# Patient Record
Sex: Female | Born: 1960 | Race: White | Hispanic: No | Marital: Married | Smoking: Never smoker
Health system: Southern US, Community
[De-identification: ages and names within clinical notes are randomized; demographics above are authoritative.]

## PROBLEM LIST (undated history)

## (undated) DIAGNOSIS — T753XXA Motion sickness, initial encounter: Secondary | ICD-10-CM

## (undated) DIAGNOSIS — M1990 Unspecified osteoarthritis, unspecified site: Secondary | ICD-10-CM

## (undated) DIAGNOSIS — E785 Hyperlipidemia, unspecified: Secondary | ICD-10-CM

## (undated) DIAGNOSIS — D649 Anemia, unspecified: Secondary | ICD-10-CM

## (undated) DIAGNOSIS — Z973 Presence of spectacles and contact lenses: Secondary | ICD-10-CM

## (undated) DIAGNOSIS — L80 Vitiligo: Secondary | ICD-10-CM

## (undated) DIAGNOSIS — I8390 Asymptomatic varicose veins of unspecified lower extremity: Secondary | ICD-10-CM

## (undated) DIAGNOSIS — E559 Vitamin D deficiency, unspecified: Secondary | ICD-10-CM

## (undated) HISTORY — DX: Vitiligo: L80

## (undated) HISTORY — DX: Unspecified osteoarthritis, unspecified site: M19.90

## (undated) HISTORY — DX: Vitamin D deficiency, unspecified: E55.9

## (undated) HISTORY — DX: Anemia, unspecified: D64.9

## (undated) HISTORY — DX: Asymptomatic varicose veins of unspecified lower extremity: I83.90

## (undated) HISTORY — DX: Hyperlipidemia, unspecified: E78.5

---

## 1998-10-30 HISTORY — PX: COLPOSCOPY: SHX161

## 2005-12-05 ENCOUNTER — Ambulatory Visit: Payer: Self-pay

## 2005-12-27 ENCOUNTER — Ambulatory Visit: Payer: Self-pay

## 2006-01-10 DIAGNOSIS — N63 Unspecified lump in unspecified breast: Secondary | ICD-10-CM | POA: Insufficient documentation

## 2006-12-11 ENCOUNTER — Ambulatory Visit: Payer: Self-pay

## 2009-05-13 DIAGNOSIS — E7800 Pure hypercholesterolemia, unspecified: Secondary | ICD-10-CM | POA: Insufficient documentation

## 2009-05-25 ENCOUNTER — Ambulatory Visit: Payer: Self-pay

## 2009-05-27 DIAGNOSIS — E559 Vitamin D deficiency, unspecified: Secondary | ICD-10-CM | POA: Insufficient documentation

## 2009-05-28 ENCOUNTER — Ambulatory Visit: Payer: Self-pay

## 2012-06-20 ENCOUNTER — Ambulatory Visit: Payer: Self-pay | Admitting: Family Medicine

## 2012-09-19 HISTORY — PX: COLONOSCOPY: SHX174

## 2014-05-21 ENCOUNTER — Ambulatory Visit: Payer: Self-pay | Admitting: Family Medicine

## 2016-05-24 DIAGNOSIS — L80 Vitiligo: Secondary | ICD-10-CM | POA: Insufficient documentation

## 2016-05-24 DIAGNOSIS — I8390 Asymptomatic varicose veins of unspecified lower extremity: Secondary | ICD-10-CM | POA: Insufficient documentation

## 2016-05-24 DIAGNOSIS — M1990 Unspecified osteoarthritis, unspecified site: Secondary | ICD-10-CM | POA: Insufficient documentation

## 2016-05-24 DIAGNOSIS — R609 Edema, unspecified: Secondary | ICD-10-CM | POA: Insufficient documentation

## 2016-05-24 DIAGNOSIS — D649 Anemia, unspecified: Secondary | ICD-10-CM | POA: Insufficient documentation

## 2016-05-24 DIAGNOSIS — R9431 Abnormal electrocardiogram [ECG] [EKG]: Secondary | ICD-10-CM | POA: Insufficient documentation

## 2016-05-25 ENCOUNTER — Encounter: Payer: Self-pay | Admitting: Physician Assistant

## 2016-05-25 ENCOUNTER — Ambulatory Visit (INDEPENDENT_AMBULATORY_CARE_PROVIDER_SITE_OTHER): Payer: BC Managed Care – PPO | Admitting: Physician Assistant

## 2016-05-25 VITALS — BP 118/74 | HR 72 | Temp 98.5°F | Resp 16 | Ht 67.25 in | Wt 187.0 lb

## 2016-05-25 DIAGNOSIS — R251 Tremor, unspecified: Secondary | ICD-10-CM

## 2016-05-25 DIAGNOSIS — R258 Other abnormal involuntary movements: Secondary | ICD-10-CM | POA: Diagnosis not present

## 2016-05-25 DIAGNOSIS — Z0000 Encounter for general adult medical examination without abnormal findings: Secondary | ICD-10-CM | POA: Diagnosis not present

## 2016-05-25 DIAGNOSIS — Z124 Encounter for screening for malignant neoplasm of cervix: Secondary | ICD-10-CM | POA: Diagnosis not present

## 2016-05-25 DIAGNOSIS — Z136 Encounter for screening for cardiovascular disorders: Secondary | ICD-10-CM

## 2016-05-25 DIAGNOSIS — Z1239 Encounter for other screening for malignant neoplasm of breast: Secondary | ICD-10-CM | POA: Diagnosis not present

## 2016-05-25 DIAGNOSIS — Z13220 Encounter for screening for lipoid disorders: Secondary | ICD-10-CM | POA: Diagnosis not present

## 2016-05-25 NOTE — Progress Notes (Signed)
Patient: Rebekah [name], Female    DOB: 1960-10-31, 55 y.o.   MRN: [id number] Visit Date: 05/25/2016  Today's Provider: Mar Daring, PA-C   Chief Complaint  Patient presents with  . Annual Exam   Subjective:    Annual physical exam Rebekah [name] is a 55 y.o. female who presents today for health maintenance and complete physical. She feels well. She reports exercising 4 days a week. She reports she is sleeping well.   Review of Systems  Constitutional: Negative.   HENT: Negative.   Eyes: Negative.   Respiratory: Negative.   Cardiovascular: Negative.   Gastrointestinal: Negative.   Endocrine: Negative.   Genitourinary: Negative.   Musculoskeletal: Negative.   Skin: Negative.   Allergic/Immunologic: Negative.   Neurological: Negative.   Hematological: Negative.   Psychiatric/Behavioral: Negative.     Social History      She  reports that she has never smoked. She does not have any smokeless tobacco history on file. She reports that she drinks about 0.6 oz of alcohol per week . She reports that she does not use drugs.       Social History   Social History  . Marital status: Married    Spouse name: N/A  . Number of children: 3  . Years of education: 12   Occupational History  . Teacher  [employer]   Social History Main Topics  . Smoking status: Never Smoker  . Smokeless tobacco: None  . Alcohol use 0.6 oz/week    1 Glasses of wine per week  . Drug use: No  . Sexual activity: Not Asked   Other Topics Concern  . None   Social History Narrative  . None    Past Medical History:  Diagnosis Date  . Anemia   . Arthritis   . Hyperlipidemia   . Varicose veins   . Vitamin D deficiency   . Vitiligo      Patient Active Problem List   Diagnosis Date Noted  . Abnormal ECG 05/24/2016  . Absolute anemia 05/24/2016  . Arthritis 05/24/2016  . Accumulation of fluid in tissues 05/24/2016  . 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 Vitiligo 05/24/2016  . Avitaminosis D 05/27/2009  . Hypercholesterolemia without hypertriglyceridemia 05/13/2009  . Breast lump 01/10/2006    Past Surgical History:  Procedure Laterality Date  . COLPOSCOPY  2000    Family History        Family Status  Relation Status  . Mother Deceased  . Father Deceased  . Brother Deceased        Her family history includes Alcohol abuse in her brother; Cancer in her brother and father; Hypertension in her father; Melanoma in her father.    No Known Allergies  Current Meds  Medication Sig  . Calcium Carbonate-Vitamin D 600-200 MG-UNIT TABS Take by mouth.  . Omega-3 Fatty Acids (FISH OIL MAXIMUM STRENGTH) 1200 MG CAPS Take by mouth.    Patient Care Team: Mar Daring, PA-C as PCP - General (Family Medicine)     Objective:   Vitals: BP 118/74 (BP Location: Left Arm, Patient Position: Sitting, Cuff Size: Normal)   Pulse 72   Temp 98.5 F (36.9 C)   Resp 16   Ht 5' 7.25" (1.708 m)   Wt 187 lb (84.8 kg)   BMI 29.07 kg/m    Physical Exam  Constitutional: She is oriented to person, place, and time. She  appears well-developed and well-nourished. No distress.  HENT:  Head: Normocephalic and atraumatic.  Right Ear: Hearing, tympanic membrane, external ear and ear canal normal.  Left Ear: Hearing, tympanic membrane, external ear and ear canal normal.  Nose: Nose normal.  Mouth/Throat: Uvula is midline, oropharynx is clear and moist and mucous membranes are normal. No oropharyngeal exudate.  Eyes: Conjunctivae and EOM are normal. Pupils are equal, round, and reactive to light. Right eye exhibits no discharge. Left eye exhibits no discharge. No scleral icterus.  Neck: Normal range of motion. Neck supple. No JVD present. Carotid bruit is not present. No tracheal deviation present. No thyromegaly present.  Cardiovascular: Normal rate, regular rhythm, normal heart sounds and intact distal pulses.  Exam reveals no gallop and no  friction rub.   No murmur heard. Pulmonary/Chest: Effort normal and breath sounds normal. No respiratory distress. She has no wheezes. She has no rales. She exhibits no tenderness. Right breast exhibits no inverted nipple, no mass, no nipple discharge, no skin change and no tenderness. Left breast exhibits no inverted nipple, no mass, no nipple discharge, no skin change and no tenderness. Breasts are symmetrical.  Abdominal: Soft. Bowel sounds are normal. She exhibits no distension and no mass. There is no tenderness. There is no rebound and no guarding. Hernia confirmed negative in the right inguinal area and confirmed negative in the left inguinal area.  Genitourinary: Rectum normal, vagina normal and uterus normal. No breast swelling, tenderness, discharge or bleeding. Pelvic exam was performed with patient supine. There is no rash, tenderness, lesion or injury on the right labia. There is no rash, tenderness, lesion or injury on the left labia. Cervix exhibits no motion tenderness, no discharge and no friability. Right adnexum displays no mass, no tenderness and no fullness. Left adnexum displays no mass, no tenderness and no fullness. No erythema, tenderness or bleeding in the vagina. No signs of injury around the vagina. No vaginal discharge found.  Musculoskeletal: Normal range of motion. She exhibits no edema or tenderness.  Lymphadenopathy:    She has no cervical adenopathy.       Right: No inguinal adenopathy present.       Left: No inguinal adenopathy present.  Neurological: She is alert and oriented to person, place, and time. She has normal reflexes. No cranial nerve deficit. Coordination normal.  Skin: Skin is warm and dry. No rash noted. She is not diaphoretic.  Psychiatric: She has a normal mood and affect. Her behavior is normal. Judgment and thought content normal.  Vitals reviewed.   Assessment & Plan:     Routine Health Maintenance and Physical Exam  Exercise Activities and  Dietary recommendations Goals    None      Immunization History  Administered Date(s) Administered  . Tdap 05/13/2009    Health Maintenance  Topic Date Due  . Hepatitis C Screening  Aug 11, 1961  . HIV Screening  03/07/1974  . PAP SMEAR  06/13/2015  . MAMMOGRAM  05/15/2016  . INFLUENZA VACCINE  05/30/2016  . COLONOSCOPY  09/24/2017  . TETANUS/TDAP  05/14/2019      Discussed health benefits of physical activity, and encouraged her to engage in regular exercise appropriate for her age and condition.    1. Annual physical exam Normal physical exam today. Will check labs as below and f/u pending lab results. If labs are stable and WNL she will not need to have these rechecked for one year at her next annual physical exam. She is to call the  office in the meantime if she has any acute issue, questions or concerns. - CBC with Differential - Comprehensive metabolic panel - TSH  2. Cervical cancer screening Pap collected today. Will send as below and f/u pending results. - Pap IG and HPV (high risk) DNA detection ([facility])  3. Breast cancer screening Breast exam today was normal. There is no family history of breast cancer. She does perform regular self breast exams. Mammogram was ordered as below. Information for [facility] was given to patient so she may schedule her mammogram at her convenience. - Mammogram Digital Screening; Future  4. Lip tremor Will check labs as below and f/u pending results. - B12 - Vitamin D (25 hydroxy)  5. Encounter for lipid screening for cardiovascular disease Will check labs as below and f/u pending results. - Lipid panel  --------------------------------------------------------------------    Mar Daring, PA-C  [facility]

## 2016-05-25 NOTE — Patient Instructions (Addendum)
Health Maintenance, Female Adopting a healthy lifestyle and getting preventive care can go a long way to promote health and wellness. Talk with your health care provider about what schedule of regular examinations is right for you. This is a good chance for you to check in with your provider about disease prevention and staying healthy. In between checkups, there are plenty of things you can do on your own. Experts have done a lot of research about which lifestyle changes and preventive measures are most likely to keep you healthy. Ask your health care provider for more information. WEIGHT AND DIET  Eat a healthy diet  Be sure to include plenty of vegetables, fruits, low-fat dairy products, and lean protein.  Do not eat a lot of foods high in solid fats, added sugars, or salt.  Get regular exercise. This is one of the most important things you can do for your health.  Most adults should exercise for at least 150 minutes each week. The exercise should increase your heart rate and make you sweat (moderate-intensity exercise).  Most adults should also do strengthening exercises at least twice a week. This is in addition to the moderate-intensity exercise.  Maintain a healthy weight  Body mass index (BMI) is a measurement that can be used to identify possible weight problems. It estimates body fat based on height and weight. Your health care provider can help determine your BMI and help you achieve or maintain a healthy weight.  For females 57 years of age and older:   A BMI below 18.5 is considered underweight.  A BMI of 18.5 to 24.9 is normal.  A BMI of 25 to 29.9 is considered overweight.  A BMI of 30 and above is considered obese.  Watch levels of cholesterol and blood lipids  You should start having your blood tested for lipids and cholesterol at 57 years of age, then have this test every 5 years.  You may need to have your cholesterol levels checked more often if:  Your lipid  or cholesterol levels are high.  You are older than 57 years of age.  You are at high risk for heart disease.  CANCER SCREENING   Lung Cancer  Lung cancer screening is recommended for adults 57-66 years old who are at high risk for lung cancer because of a history of smoking.  A yearly low-dose CT scan of the lungs is recommended for people who:  Currently smoke.  Have quit within the past 15 years.  Have at least a 30-pack-year history of smoking. A pack year is smoking an average of one pack of cigarettes a day for 1 year.  Yearly screening should continue until it has been 15 years since you quit.  Yearly screening should stop if you develop a health problem that would prevent you from having lung cancer treatment.  Breast Cancer  Practice breast self-awareness. This means understanding how your breasts normally appear and feel.  It also means doing regular breast self-exams. Let your health care provider know about any changes, no matter how small.  If you are in your 20s or 30s, you should have a clinical breast exam (CBE) by a health care provider every 1-3 years as part of a regular health exam.  If you are 25 or older, have a CBE every year. Also consider having a breast X-ray (mammogram) every year.  If you have a family history of breast cancer, talk to your health care provider about genetic screening.  If you  are at high risk for breast cancer, talk to your health care provider about having an MRI and a mammogram every year.  Breast cancer gene (BRCA) assessment is recommended for women who have family members with BRCA-related cancers. BRCA-related cancers include:  Breast.  Ovarian.  Tubal.  Peritoneal cancers.  Results of the assessment will determine the need for genetic counseling and BRCA1 and BRCA2 testing. Cervical Cancer Your health care provider may recommend that you be screened regularly for cancer of the pelvic organs (ovaries, uterus, and  vagina). This screening involves a pelvic examination, including checking for microscopic changes to the surface of your cervix (Pap test). You may be encouraged to have this screening done every 3 years, beginning at age 21.  For women ages 30-65, health care providers may recommend pelvic exams and Pap testing every 3 years, or they may recommend the Pap and pelvic exam, combined with testing for human papilloma virus (HPV), every 5 years. Some types of HPV increase your risk of cervical cancer. Testing for HPV may also be done on women of any age with unclear Pap test results.  Other health care providers may not recommend any screening for nonpregnant women who are considered low risk for pelvic cancer and who do not have symptoms. Ask your health care provider if a screening pelvic exam is right for you.  If you have had past treatment for cervical cancer or a condition that could lead to cancer, you need Pap tests and screening for cancer for at least 20 years after your treatment. If Pap tests have been discontinued, your risk factors (such as having a new sexual partner) need to be reassessed to determine if screening should resume. Some women have medical problems that increase the chance of getting cervical cancer. In these cases, your health care provider may recommend more frequent screening and Pap tests. Colorectal Cancer  This type of cancer can be detected and often prevented.  Routine colorectal cancer screening usually begins at 57 years of age and continues through 57 years of age.  Your health care provider may recommend screening at an earlier age if you have risk factors for colon cancer.  Your health care provider may also recommend using home test kits to check for hidden blood in the stool.  A small camera at the end of a tube can be used to examine your colon directly (sigmoidoscopy or colonoscopy). This is done to check for the earliest forms of colorectal  cancer.  Routine screening usually begins at age 50.  Direct examination of the colon should be repeated every 5-10 years through 57 years of age. However, you may need to be screened more often if early forms of precancerous polyps or small growths are found. Skin Cancer  Check your skin from head to toe regularly.  Tell your health care provider about any new moles or changes in moles, especially if there is a change in a mole's shape or color.  Also tell your health care provider if you have a mole that is larger than the size of a pencil eraser.  Always use sunscreen. Apply sunscreen liberally and repeatedly throughout the day.  Protect yourself by wearing long sleeves, pants, a wide-brimmed hat, and sunglasses whenever you are outside. HEART DISEASE, DIABETES, AND HIGH BLOOD PRESSURE   High blood pressure causes heart disease and increases the risk of stroke. High blood pressure is more likely to develop in:  People who have blood pressure in the high end   of the normal range (130-139/85-89 mm Hg).  People who are overweight or obese.  People who are African American.  If you are 38-23 years of age, have your blood pressure checked every 3-5 years. If you are 61 years of age or older, have your blood pressure checked every year. You should have your blood pressure measured twice--once when you are at a hospital or clinic, and once when you are not at a hospital or clinic. Record the average of the two measurements. To check your blood pressure when you are not at a hospital or clinic, you can use:  An automated blood pressure machine at a pharmacy.  A home blood pressure monitor.  If you are between 45 years and 39 years old, ask your health care provider if you should take aspirin to prevent strokes.  Have regular diabetes screenings. This involves taking a blood sample to check your fasting blood sugar level.  If you are at a normal weight and have a low risk for diabetes,  have this test once every three years after 57 years of age.  If you are overweight and have a high risk for diabetes, consider being tested at a younger age or more often. PREVENTING INFECTION  Hepatitis B  If you have a higher risk for hepatitis B, you should be screened for this virus. You are considered at high risk for hepatitis B if:  You were born in a country where hepatitis B is common. Ask your health care provider which countries are considered high risk.  Your parents were born in a high-risk country, and you have not been immunized against hepatitis B (hepatitis B vaccine).  You have HIV or AIDS.  You use needles to inject street drugs.  You live with someone who has hepatitis B.  You have had sex with someone who has hepatitis B.  You get hemodialysis treatment.  You take certain medicines for conditions, including cancer, organ transplantation, and autoimmune conditions. Hepatitis C  Blood testing is recommended for:  Everyone born from [birth year] through 1967.  Anyone with known risk factors for hepatitis C. Sexually transmitted infections (STIs)  You should be screened for sexually transmitted infections (STIs) including gonorrhea and chlamydia if:  You are sexually active and are younger than 57 years of age.  You are older than 57 years of age and your health care provider tells you that you are at risk for this type of infection.  Your sexual activity has changed since you were last screened and you are at an increased risk for chlamydia or gonorrhea. Ask your health care provider if you are at risk.  If you do not have HIV, but are at risk, it may be recommended that you take a prescription medicine daily to prevent HIV infection. This is called pre-exposure prophylaxis (PrEP). You are considered at risk if:  You are sexually active and do not regularly use condoms or know the HIV status of your partner(s).  You take drugs by injection.  You are sexually  active with a partner who has HIV. Talk with your health care provider about whether you are at high risk of being infected with HIV. If you choose to begin PrEP, you should first be tested for HIV. You should then be tested every 3 months for as long as you are taking PrEP.  PREGNANCY   If you are premenopausal and you may become pregnant, ask your health care provider about preconception counseling.  If you may  become pregnant, take 400 to 800 micrograms (mcg) of folic acid every day.  If you want to prevent pregnancy, talk to your health care provider about birth control (contraception). OSTEOPOROSIS AND MENOPAUSE   Osteoporosis is a disease in which the bones lose minerals and strength with aging. This can result in serious bone fractures. Your risk for osteoporosis can be identified using a bone density scan.  If you are 53 years of age or older, or if you are at risk for osteoporosis and fractures, ask your health care provider if you should be screened.  Ask your health care provider whether you should take a calcium or vitamin D supplement to lower your risk for osteoporosis.  Menopause may have certain physical symptoms and risks.  Hormone replacement therapy may reduce some of these symptoms and risks. Talk to your health care provider about whether hormone replacement therapy is right for you.  HOME CARE INSTRUCTIONS   Schedule regular health, dental, and eye exams.  Stay current with your immunizations.   Do not use any tobacco products including cigarettes, chewing tobacco, or electronic cigarettes.  If you are pregnant, do not drink alcohol.  If you are breastfeeding, limit how much and how often you drink alcohol.  Limit alcohol intake to no more than 1 drink per day for nonpregnant women. One drink equals 12 ounces of beer, 5 ounces of wine, or 1 ounces of hard liquor.  Do not use street drugs.  Do not share needles.  Ask your health care provider for help if  you need support or information about quitting drugs.  Tell your health care provider if you often feel depressed.  Tell your health care provider if you have ever been abused or do not feel safe at home.   This information is not intended to replace advice given to you by your health care provider. Make sure you discuss any questions you have with your health care provider.   Document Released: 05/01/2011 Document Revised: 11/06/2014 Document Reviewed: 09/17/2013 Elsevier Interactive Patient Education 2016 [employer].  Hemorrhoids Hemorrhoids are swollen veins around the rectum or anus. There are two types of hemorrhoids:   Internal hemorrhoids. These occur in the veins just inside the rectum. They may poke through to the outside and become irritated and painful.  External hemorrhoids. These occur in the veins outside the anus and can be felt as a painful swelling or hard lump near the anus. CAUSES  Pregnancy.   Obesity.   Constipation or diarrhea.   Straining to have a bowel movement.   Sitting for long periods on the toilet.  Heavy lifting or other activity that caused you to strain.  Anal intercourse. SYMPTOMS   Pain.   Anal itching or irritation.   Rectal bleeding.   Fecal leakage.   Anal swelling.   One or more lumps around the anus.  DIAGNOSIS  Your caregiver may be able to diagnose hemorrhoids by visual examination. Other examinations or tests that may be performed include:   Examination of the rectal area with a gloved hand (digital rectal exam).   Examination of anal canal using a small tube (scope).   A blood test if you have lost a significant amount of blood.  A test to look inside the colon (sigmoidoscopy or colonoscopy). TREATMENT Most hemorrhoids can be treated at home. However, if symptoms do not seem to be getting better or if you have a lot of rectal bleeding, your caregiver may perform a procedure to  help make the hemorrhoids  get smaller or remove them completely. Possible treatments include:   Placing a rubber band at the base of the hemorrhoid to cut off the circulation (rubber band ligation).   Injecting a chemical to shrink the hemorrhoid (sclerotherapy).   Using a tool to burn the hemorrhoid (infrared light therapy).   Surgically removing the hemorrhoid (hemorrhoidectomy).   Stapling the hemorrhoid to block blood flow to the tissue (hemorrhoid stapling).  HOME CARE INSTRUCTIONS   Eat foods with fiber, such as whole grains, beans, nuts, fruits, and vegetables. Ask your doctor about taking products with added fiber in them (fibersupplements).  Increase fluid intake. Drink enough water and fluids to keep your urine clear or pale yellow.   Exercise regularly.   Go to the bathroom when you have the urge to have a bowel movement. Do not wait.   Avoid straining to have bowel movements.   Keep the anal area dry and clean. Use wet toilet paper or moist towelettes after a bowel movement.   Medicated creams and suppositories may be used or applied as directed.   Only take over-the-counter or prescription medicines as directed by your caregiver.   Take warm sitz baths for 15-20 minutes, 3-4 times a day to ease pain and discomfort.   Place ice packs on the hemorrhoids if they are tender and swollen. Using ice packs between sitz baths may be helpful.   Put ice in a plastic bag.   Place a towel between your skin and the bag.   Leave the ice on for 15-20 minutes, 3-4 times a day.   Do not use a donut-shaped pillow or sit on the toilet for long periods. This increases blood pooling and pain.  SEEK MEDICAL CARE IF:  You have increasing pain and swelling that is not controlled by treatment or medicine.  You have uncontrolled bleeding.  You have difficulty or you are unable to have a bowel movement.  You have pain or inflammation outside the area of the hemorrhoids. MAKE SURE  YOU:  Understand these instructions.  Will watch your condition.  Will get help right away if you are not doing well or get worse.   This information is not intended to replace advice given to you by your health care provider. Make sure you discuss any questions you have with your health care provider.   Document Released: 10/13/2000 Document Revised: 10/02/2012 Document Reviewed: 08/20/2012 Elsevier Interactive Patient Education [employer].

## 2016-05-30 ENCOUNTER — Telehealth: Payer: Self-pay

## 2016-05-30 ENCOUNTER — Other Ambulatory Visit: Payer: Self-pay | Admitting: Physician Assistant

## 2016-05-30 ENCOUNTER — Ambulatory Visit
Admission: RE | Admit: 2016-05-30 | Discharge: 2016-05-30 | Disposition: A | Discharge status: Home / Self Care | Payer: BC Managed Care – PPO | Source: Ambulatory Visit | Attending: Physician Assistant | Admitting: Physician Assistant

## 2016-05-30 DIAGNOSIS — Z1231 Encounter for screening mammogram for malignant neoplasm of breast: Secondary | ICD-10-CM | POA: Diagnosis not present

## 2016-05-30 DIAGNOSIS — Z1239 Encounter for other screening for malignant neoplasm of breast: Secondary | ICD-10-CM

## 2016-05-30 LAB — PAP IG AND HPV HIGH-RISK
HPV, high-risk: NEGATIVE
PAP Smear Comment: 0

## 2016-05-30 LAB — CBC WITH DIFFERENTIAL/PLATELET
BASOS ABS: 0 10*3/uL (ref 0.0–0.2)
Basos: 0 %
EOS (ABSOLUTE): 0.1 10*3/uL (ref 0.0–0.4)
EOS: 2 %
HEMATOCRIT: 35.7 % (ref 34.0–46.6)
HEMOGLOBIN: 11.8 g/dL (ref 11.1–15.9)
IMMATURE GRANS (ABS): 0 10*3/uL (ref 0.0–0.1)
Immature Granulocytes: 0 %
LYMPHS ABS: 1.8 10*3/uL (ref 0.7–3.1)
LYMPHS: 32 %
MCH: 29 pg (ref 26.6–33.0)
MCHC: 33.1 g/dL (ref 31.5–35.7)
MCV: 88 fL (ref 79–97)
MONOCYTES: 12 %
Monocytes Absolute: 0.7 10*3/uL (ref 0.1–0.9)
NEUTROS ABS: 3 10*3/uL (ref 1.4–7.0)
Neutrophils: 54 %
Platelets: 308 10*3/uL (ref 150–379)
RBC: 4.07 x10E6/uL (ref 3.77–5.28)
RDW: 13.7 % (ref 12.3–15.4)
WBC: 5.6 10*3/uL (ref 3.4–10.8)

## 2016-05-30 LAB — COMPREHENSIVE METABOLIC PANEL
ALBUMIN: 4.2 g/dL (ref 3.5–5.5)
ALK PHOS: 69 IU/L (ref 39–117)
ALT: 15 IU/L (ref 0–32)
AST: 17 IU/L (ref 0–40)
Albumin/Globulin Ratio: 1.8 (ref 1.2–2.2)
BUN / CREAT RATIO: 22 (ref 9–23)
BUN: 15 mg/dL (ref 6–24)
Bilirubin Total: 0.6 mg/dL (ref 0.0–1.2)
CO2: 25 mmol/L (ref 18–29)
CREATININE: 0.67 mg/dL (ref 0.57–1.00)
Calcium: 9.2 mg/dL (ref 8.7–10.2)
Chloride: 102 mmol/L (ref 96–106)
GFR, EST AFRICAN AMERICAN: 113 mL/min/{1.73_m2} (ref >59)
GFR, EST NON AFRICAN AMERICAN: 98 mL/min/{1.73_m2} (ref >59)
GLOBULIN, TOTAL: 2.3 g/dL (ref 1.5–4.5)
GLUCOSE: 87 mg/dL (ref 65–99)
Potassium: 4.3 mmol/L (ref 3.5–5.2)
SODIUM: 142 mmol/L (ref 134–144)
TOTAL PROTEIN: 6.5 g/dL (ref 6.0–8.5)

## 2016-05-30 LAB — LIPID PANEL
CHOL/HDL RATIO: 4 ratio (ref 0.0–4.4)
Cholesterol, Total: 245 mg/dL — ABNORMAL HIGH (ref 100–199)
HDL: 62 mg/dL (ref >39)
LDL CALC: 143 mg/dL — AB (ref 0–99)
Triglycerides: 198 mg/dL — ABNORMAL HIGH (ref 0–149)
VLDL CHOLESTEROL CAL: 40 mg/dL (ref 5–40)

## 2016-05-30 LAB — TSH: TSH: 3 u[IU]/mL (ref 0.450–4.500)

## 2016-05-30 LAB — VITAMIN D 25 HYDROXY (VIT D DEFICIENCY, FRACTURES): VIT D 25 HYDROXY: 45.8 ng/mL (ref 30.0–100.0)

## 2016-05-30 LAB — VITAMIN B12: VITAMIN B 12: 493 pg/mL (ref 211–946)

## 2016-05-30 NOTE — Telephone Encounter (Signed)
Patient advised as directed below. Will get at the annual physical.  Thanks,  -Mone Commisso

## 2016-05-30 NOTE — Telephone Encounter (Signed)
-----   Message from Mar Daring, [place] sent at 05/30/2016  1:12 PM EDT ----- Pap was unsatisfactory but HPV negative. We can either repeat again now at no charge or repeat pap at next annual physical since it was HPV negative. I do apologize for this inconvenience.

## 2016-05-30 NOTE — Telephone Encounter (Signed)
Pt advised.   Thanks,   -Laura  

## 2016-05-30 NOTE — Telephone Encounter (Signed)
-----   Message from Mar Daring, [place] sent at 05/30/2016  9:00 AM EDT ----- All labs are within normal limits and stable with exception of cholesterol which is elevated. 10-yr cardiovascular risk is still low so no need to start cholesterol lowering medication at this time. Work on lifestyle modifications including eating a heart healthy diet (low fat, low sodium, low cholesterol) and adding physical activity to a regular routine. Will recheck in one year.  Thanks! -JB

## 2016-05-30 NOTE — Telephone Encounter (Signed)
-----   Message from Mar Daring, [place] sent at 05/30/2016  2:19 PM EDT ----- Normal mammogram. Repeat screening in one year.

## 2017-05-28 ENCOUNTER — Ambulatory Visit (INDEPENDENT_AMBULATORY_CARE_PROVIDER_SITE_OTHER): Payer: BC Managed Care – PPO | Admitting: Physician Assistant

## 2017-05-28 ENCOUNTER — Encounter: Payer: Self-pay | Admitting: Physician Assistant

## 2017-05-28 VITALS — BP 110/70 | HR 70 | Temp 98.2°F | Resp 16 | Ht 67.0 in | Wt 187.6 lb

## 2017-05-28 DIAGNOSIS — Z1231 Encounter for screening mammogram for malignant neoplasm of breast: Secondary | ICD-10-CM | POA: Diagnosis not present

## 2017-05-28 DIAGNOSIS — M7521 Bicipital tendinitis, right shoulder: Secondary | ICD-10-CM

## 2017-05-28 DIAGNOSIS — Z6829 Body mass index (BMI) 29.0-29.9, adult: Secondary | ICD-10-CM | POA: Diagnosis not present

## 2017-05-28 DIAGNOSIS — Z0000 Encounter for general adult medical examination without abnormal findings: Secondary | ICD-10-CM

## 2017-05-28 DIAGNOSIS — Z124 Encounter for screening for malignant neoplasm of cervix: Secondary | ICD-10-CM

## 2017-05-28 DIAGNOSIS — E7800 Pure hypercholesterolemia, unspecified: Secondary | ICD-10-CM | POA: Diagnosis not present

## 2017-05-28 DIAGNOSIS — I83893 Varicose veins of bilateral lower extremities with other complications: Secondary | ICD-10-CM | POA: Diagnosis not present

## 2017-05-28 DIAGNOSIS — Z1211 Encounter for screening for malignant neoplasm of colon: Secondary | ICD-10-CM | POA: Diagnosis not present

## 2017-05-28 DIAGNOSIS — Z131 Encounter for screening for diabetes mellitus: Secondary | ICD-10-CM | POA: Diagnosis not present

## 2017-05-28 DIAGNOSIS — Z1239 Encounter for other screening for malignant neoplasm of breast: Secondary | ICD-10-CM

## 2017-05-28 NOTE — Patient Instructions (Signed)
Health Maintenance for Postmenopausal Women Menopause is a normal process in which your reproductive ability comes to an end. This process happens gradually over a span of months to years, usually between the ages of 60 and 8. Menopause is complete when you have missed 12 consecutive menstrual periods. It is important to talk with your health care provider about some of the most common conditions that affect postmenopausal women, such as heart disease, cancer, and bone loss (osteoporosis). Adopting a healthy lifestyle and getting preventive care can help to promote your health and wellness. Those actions can also lower your chances of developing some of these common conditions. What should I know about menopause? During menopause, you may experience a number of symptoms, such as:  Moderate-to-severe hot flashes.  Night sweats.  Decrease in sex drive.  Mood swings.  Headaches.  Tiredness.  Irritability.  Memory problems.  Insomnia.  Choosing to treat or not to treat menopausal changes is an individual decision that you make with your health care provider. What should I know about hormone replacement therapy and supplements? Hormone therapy products are effective for treating symptoms that are associated with menopause, such as hot flashes and night sweats. Hormone replacement carries certain risks, especially as you become older. If you are thinking about using estrogen or estrogen with progestin treatments, discuss the benefits and risks with your health care provider. What should I know about heart disease and stroke? Heart disease, heart attack, and stroke become more likely as you age. This may be due, in part, to the hormonal changes that your body experiences during menopause. These can affect how your body processes dietary fats, triglycerides, and cholesterol. Heart attack and stroke are both medical emergencies. There are many things that you can do to help prevent heart disease  and stroke:  Have your blood pressure checked at least every 1-2 years. High blood pressure causes heart disease and increases the risk of stroke.  If you are 57-54 years old, ask your health care provider if you should take aspirin to prevent a heart attack or a stroke.  Do not use any tobacco products, including cigarettes, chewing tobacco, or electronic cigarettes. If you need help quitting, ask your health care provider.  It is important to eat a healthy diet and maintain a healthy weight. ? Be sure to include plenty of vegetables, fruits, low-fat dairy products, and lean protein. ? Avoid eating foods that are high in solid fats, added sugars, or salt (sodium).  Get regular exercise. This is one of the most important things that you can do for your health. ? Try to exercise for at least 150 minutes each week. The type of exercise that you do should increase your heart rate and make you sweat. This is known as moderate-intensity exercise. ? Try to do strengthening exercises at least twice each week. Do these in addition to the moderate-intensity exercise.  Know your numbers.Ask your health care provider to check your cholesterol and your blood glucose. Continue to have your blood tested as directed by your health care provider.  What should I know about cancer screening? There are several types of cancer. Take the following steps to reduce your risk and to catch any cancer development as early as possible. Breast Cancer  Practice breast self-awareness. ? This means understanding how your breasts normally appear and feel. ? It also means doing regular breast self-exams. Let your health care provider know about any changes, no matter how small.  If you are 40  or older, have a clinician do a breast exam (clinical breast exam or CBE) every year. Depending on your age, family history, and medical history, it may be recommended that you also have a yearly breast X-ray (mammogram).  If you  have a family history of breast cancer, talk with your health care provider about genetic screening.  If you are at high risk for breast cancer, talk with your health care provider about having an MRI and a mammogram every year.  Breast cancer (BRCA) gene test is recommended for women who have family members with BRCA-related cancers. Results of the assessment will determine the need for genetic counseling and BRCA1 and for BRCA2 testing. BRCA-related cancers include these types: ? Breast. This occurs in males or females. ? Ovarian. ? Tubal. This may also be called fallopian tube cancer. ? Cancer of the abdominal or pelvic lining (peritoneal cancer). ? Prostate. ? Pancreatic.  Cervical, Uterine, and Ovarian Cancer Your health care provider may recommend that you be screened regularly for cancer of the pelvic organs. These include your ovaries, uterus, and vagina. This screening involves a pelvic exam, which includes checking for microscopic changes to the surface of your cervix (Pap test).  For women ages 21-65, health care providers may recommend a pelvic exam and a Pap test every three years. For women ages 79-65, they may recommend the Pap test and pelvic exam, combined with testing for human papilloma virus (HPV), every five years. Some types of HPV increase your risk of cervical cancer. Testing for HPV may also be done on women of any age who have unclear Pap test results.  Other health care providers may not recommend any screening for nonpregnant women who are considered low risk for pelvic cancer and have no symptoms. Ask your health care provider if a screening pelvic exam is right for you.  If you have had past treatment for cervical cancer or a condition that could lead to cancer, you need Pap tests and screening for cancer for at least 20 years after your treatment. If Pap tests have been discontinued for you, your risk factors (such as having a new sexual partner) need to be  reassessed to determine if you should start having screenings again. Some women have medical problems that increase the chance of getting cervical cancer. In these cases, your health care provider may recommend that you have screening and Pap tests more often.  If you have a family history of uterine cancer or ovarian cancer, talk with your health care provider about genetic screening.  If you have vaginal bleeding after reaching menopause, tell your health care provider.  There are currently no reliable tests available to screen for ovarian cancer.  Lung Cancer Lung cancer screening is recommended for adults 69-62 years old who are at high risk for lung cancer because of a history of smoking. A yearly low-dose CT scan of the lungs is recommended if you:  Currently smoke.  Have a history of at least 30 pack-years of smoking and you currently smoke or have quit within the past 15 years. A pack-year is smoking an average of one pack of cigarettes per day for one year.  Yearly screening should:  Continue until it has been 15 years since you quit.  Stop if you develop a health problem that would prevent you from having lung cancer treatment.  Colorectal Cancer  This type of cancer can be detected and can often be prevented.  Routine colorectal cancer screening usually begins at  age 42 and continues through age 45.  If you have risk factors for colon cancer, your health care provider may recommend that you be screened at an earlier age.  If you have a family history of colorectal cancer, talk with your health care provider about genetic screening.  Your health care provider may also recommend using home test kits to check for hidden blood in your stool.  A small camera at the end of a tube can be used to examine your colon directly (sigmoidoscopy or colonoscopy). This is done to check for the earliest forms of colorectal cancer.  Direct examination of the colon should be repeated every  5-10 years until age 71. However, if early forms of precancerous polyps or small growths are found or if you have a family history or genetic risk for colorectal cancer, you may need to be screened more often.  Skin Cancer  Check your skin from head to toe regularly.  Monitor any moles. Be sure to tell your health care provider: ? About any new moles or changes in moles, especially if there is a change in a mole's shape or color. ? If you have a mole that is larger than the size of a pencil eraser.  If any of your family members has a history of skin cancer, especially at a young age, talk with your health care provider about genetic screening.  Always use sunscreen. Apply sunscreen liberally and repeatedly throughout the day.  Whenever you are outside, protect yourself by wearing long sleeves, pants, a wide-brimmed hat, and sunglasses.  What should I know about osteoporosis? Osteoporosis is a condition in which bone destruction happens more quickly than new bone creation. After menopause, you may be at an increased risk for osteoporosis. To help prevent osteoporosis or the bone fractures that can happen because of osteoporosis, the following is recommended:  If you are 46-71 years old, get at least 1,000 mg of calcium and at least 600 mg of vitamin D per day.  If you are older than age 55 but younger than age 65, get at least 1,200 mg of calcium and at least 600 mg of vitamin D per day.  If you are older than age 54, get at least 1,200 mg of calcium and at least 800 mg of vitamin D per day.  Smoking and excessive alcohol intake increase the risk of osteoporosis. Eat foods that are rich in calcium and vitamin D, and do weight-bearing exercises several times each week as directed by your health care provider. What should I know about how menopause affects my mental health? Depression may occur at any age, but it is more common as you become older. Common symptoms of depression  include:  Low or sad mood.  Changes in sleep patterns.  Changes in appetite or eating patterns.  Feeling an overall lack of motivation or enjoyment of activities that you previously enjoyed.  Frequent crying spells.  Talk with your health care provider if you think that you are experiencing depression. What should I know about immunizations? It is important that you get and maintain your immunizations. These include:  Tetanus, diphtheria, and pertussis (Tdap) booster vaccine.  Influenza every year before the flu season begins.  Pneumonia vaccine.  Shingles vaccine.  Your health care provider may also recommend other immunizations. This information is not intended to replace advice given to you by your health care provider. Make sure you discuss any questions you have with your health care provider. Document Released: 12/08/2005  Document Revised: 05/05/2016 Document Reviewed: 07/20/2015 Elsevier Interactive Patient Education  [employer].

## 2017-05-28 NOTE — Progress Notes (Signed)
Patient: Rebekah [name], Female    DOB: 27-Jan-1961, 56 y.o.   MRN: [id number] Visit Date: 05/28/2017  Today's Provider: Mar Daring, PA-C   Chief Complaint  Patient presents with  . Annual Exam   Subjective:    Annual physical exam Rebekah [name] is a 56 y.o. female who presents today for health maintenance and complete physical. She feels well. She reports exercising. She reports she is sleeping well.  Last CPE:05/25/16 Mammogram: 05/30/16 BO-RADS 1 Pap: 05/25/16 Unsatisfactory, HPV-Negative Colon:09/24/12 Normal-Repeat in 5 years, [facility]; positive family history -----------------------------------------------------------------   Review of Systems  Constitutional: Negative.   HENT: Negative.   Eyes: Negative.   Respiratory: Negative.   Cardiovascular: Positive for leg swelling (feet-once in while;more the left foot).  Gastrointestinal: Positive for abdominal pain (she reports having some abdominal pain like when she used to ovulate-left side) and anal bleeding (only when she wipes;hemorrhoids).  Endocrine: Negative.   Genitourinary: Negative.   Musculoskeletal: Positive for arthralgias (Right shoulder pain).  Skin: Negative.   Allergic/Immunologic: Negative.   Neurological: Negative.   Hematological: Negative.   Psychiatric/Behavioral: Negative.     Social History      She  reports that she has never smoked. She has never used smokeless tobacco. She reports that she drinks about 0.6 oz of alcohol per week . She reports that she does not use drugs.       Social History   Social History  . Marital status: Married    Spouse name: N/A  . Number of children: 3  . Years of education: 12   Occupational History  . Teacher  [employer]   Social History Main Topics  . Smoking status: Never Smoker  . Smokeless tobacco: Never Used  . Alcohol use 0.6 oz/week    1 Glasses of wine per week  . Drug use:  No  . Sexual activity: Not Asked   Other Topics Concern  . None   Social History Narrative  . None    Past Medical History:  Diagnosis Date  . Anemia   . Arthritis   . Hyperlipidemia   . Varicose veins   . Vitamin D deficiency   . Vitiligo      Patient Active Problem List   Diagnosis Date Noted  . Abnormal ECG 05/24/2016  . Absolute anemia 05/24/2016  . Arthritis 05/24/2016  . Accumulation of fluid in tissues 05/24/2016  . 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 Vitiligo 05/24/2016  . Avitaminosis D 05/27/2009  . Hypercholesterolemia without hypertriglyceridemia 05/13/2009  . Breast lump 01/10/2006    Past Surgical History:  Procedure Laterality Date  . COLPOSCOPY  2000    Family History        Family Status  Relation Status  . Mother Deceased  . Father Deceased  . Brother Deceased  . Neg Hx (Not Specified)        Her family history includes Alcohol abuse in her brother; Cancer in her brother and father; Hypertension in her father; Melanoma in her father.     No Known Allergies   Current Outpatient Prescriptions:  .  Cholecalciferol (VITAMIN D PO), Take by mouth., Disp: , Rfl:  .  Misc Natural Products (GRAPE SEED COMPLEX) CAPS, Take by mouth., Disp: , Rfl:  .  Omega-3 Fatty Acids (FISH OIL MAXIMUM STRENGTH) 1200 MG CAPS, Take by mouth., Disp: , Rfl:  .  TURMERIC PO, Take by mouth., Disp: ,  Rfl:  .  aspirin (ASPIRIN ADULT LOW STRENGTH) 81 MG EC tablet, Take by mouth., Disp: , Rfl:    Patient Care Team: Mar Daring, PA-C as PCP - General (Family Medicine)      Objective:   Vitals: BP 110/70 (BP Location: Right Arm, Patient Position: Sitting, Cuff Size: Normal)   Pulse 70   Temp 98.2 F (36.8 C) (Oral)   Resp 16   Ht 5\' 7"  (1.702 m)   Wt 187 lb 9.6 oz (85.1 kg)   BMI 29.38 kg/m     Physical Exam  Constitutional: She is oriented to person, place, and time. She appears well-developed and well-nourished. No distress.  HENT:  Head: Normocephalic  and atraumatic.  Right Ear: Hearing, tympanic membrane, external ear and ear canal normal.  Left Ear: Hearing, tympanic membrane, external ear and ear canal normal.  Nose: Nose normal.  Mouth/Throat: Uvula is midline, oropharynx is clear and moist and mucous membranes are normal. No oropharyngeal exudate.  Eyes: Pupils are equal, round, and reactive to light. Conjunctivae and EOM are normal. Right eye exhibits no discharge. Left eye exhibits no discharge. No scleral icterus.  Neck: Normal range of motion. Neck supple. No JVD present. Carotid bruit is not present. No tracheal deviation present. No thyromegaly present.  Cardiovascular: Normal rate, regular rhythm, normal heart sounds, intact distal pulses and normal pulses.  Exam reveals no gallop and no friction rub.   No murmur heard. Pulmonary/Chest: Effort normal and breath sounds normal. No respiratory distress. She has no wheezes. She has no rales. She exhibits no tenderness. Right breast exhibits no inverted nipple, no mass, no nipple discharge, no skin change and no tenderness. Left breast exhibits no inverted nipple, no mass, no nipple discharge, no skin change and no tenderness. Breasts are symmetrical.  Abdominal: Soft. Normal appearance and bowel sounds are normal. She exhibits no distension and no mass. There is no tenderness. There is no rebound and no guarding. Hernia confirmed negative in the right inguinal area and confirmed negative in the left inguinal area.  Genitourinary: Rectum normal, vagina normal and uterus normal. No breast swelling, tenderness, discharge or bleeding. Pelvic exam was performed with patient supine. There is no rash, tenderness, lesion or injury on the right labia. There is no rash, tenderness, lesion or injury on the left labia. Cervix exhibits no motion tenderness, no discharge and no friability. Right adnexum displays no mass, no tenderness and no fullness. Left adnexum displays no mass, no tenderness and no  fullness. No erythema, tenderness or bleeding in the vagina. No signs of injury around the vagina. No vaginal discharge found.  Musculoskeletal: Normal range of motion. She exhibits edema (trace bilaterally).       Right shoulder: She exhibits tenderness (over biceps tendon long head). She exhibits normal range of motion, no bony tenderness, no pain, normal pulse and normal strength.  Varicosities noted bilaterally with reticular and spider veins  Lymphadenopathy:    She has no cervical adenopathy.       Right: No inguinal adenopathy present.       Left: No inguinal adenopathy present.  Neurological: She is alert and oriented to person, place, and time. She has normal strength and normal reflexes. No cranial nerve deficit. Coordination normal.  Skin: Skin is warm and dry. No rash noted. She is not diaphoretic.  Psychiatric: She has a normal mood and affect. Her speech is normal and behavior is normal. Judgment and thought content normal. Cognition and memory are normal.  Vitals reviewed.    Depression Screen PHQ 2/9 Scores 05/28/2017  PHQ - 2 Score 0  PHQ- 9 Score 0      Assessment & Plan:     Routine Health Maintenance and Physical Exam  Exercise Activities and Dietary recommendations Goals    None      Immunization History  Administered Date(s) Administered  . Tdap 05/13/2009    Health Maintenance  Topic Date Due  . Hepatitis C Screening  October 31, 1960  . HIV Screening  03/07/1974  . INFLUENZA VACCINE  05/30/2017  . COLONOSCOPY  09/24/2017  . MAMMOGRAM  05/30/2018  . TETANUS/TDAP  05/14/2019  . PAP SMEAR  05/26/2019     Discussed health benefits of physical activity, and encouraged her to engage in regular exercise appropriate for her age and condition.    1. Annual physical exam Normal physical exam today. Will check labs as below and f/u pending lab results. If labs are stable and WNL she will not need to have these rechecked for one year at her next annual  physical exam. She is to call the office in the meantime if she has any acute issue, questions or concerns. - CBC with Differential/Platelet - Comprehensive metabolic panel - TSH  2. Hypercholesterolemia without hypertriglyceridemia Using fish oil, tumeric and grapeseed oil for lowering cholesterol. Will check labs as below and f/u pending results. - Lipid panel  3. Encounter for screening for diabetes mellitus Will check labs as below and f/u pending results. - Hemoglobin A1c  4. Screening for breast cancer Breast exam today was normal. There is no family history of breast cancer. She does perform regular self breast exams. Mammogram was ordered as below. Information for [facility] was given to patient so she may schedule her mammogram at her convenience. - MM DIGITAL SCREENING BILATERAL; Future  5. Encounter for screening for cervical cancer  Pap collected today. Will send as below and f/u pending results. Last year was insufficient. - Pap IG and HPV (high risk) DNA detection  6. Screening for colon cancer Due for colonoscopy. Last done by Dr. Candace Cruise. Family history of colon cancer.  - Ambulatory referral to Gastroenterology  7. BMI 29.0-29.9,adult Counseled patient on healthy lifestyle modifications including dieting and exercise.   8. Varicose veins of both legs with edema Discussed compression stockings and elevating legs when at rest.   9. Biceps tendinitis of right shoulder Continue conservative management as this is improving.  --------------------------------------------------------------------    Mar Daring, PA-C  [facility]

## 2017-05-29 ENCOUNTER — Telehealth: Payer: Self-pay

## 2017-05-29 LAB — COMPREHENSIVE METABOLIC PANEL
A/G RATIO: 1.7 (ref 1.2–2.2)
ALK PHOS: 72 IU/L (ref 39–117)
ALT: 11 IU/L (ref 0–32)
AST: 20 IU/L (ref 0–40)
Albumin: 4.2 g/dL (ref 3.5–5.5)
BILIRUBIN TOTAL: 0.6 mg/dL (ref 0.0–1.2)
BUN/Creatinine Ratio: 20 (ref 9–23)
BUN: 16 mg/dL (ref 6–24)
CALCIUM: 9.1 mg/dL (ref 8.7–10.2)
CHLORIDE: 103 mmol/L (ref 96–106)
CO2: 24 mmol/L (ref 20–29)
Creatinine, Ser: 0.79 mg/dL (ref 0.57–1.00)
GFR calc Af Amer: 95 mL/min/{1.73_m2} (ref >59)
GFR calc non Af Amer: 83 mL/min/{1.73_m2} (ref >59)
GLOBULIN, TOTAL: 2.5 g/dL (ref 1.5–4.5)
Glucose: 88 mg/dL (ref 65–99)
Potassium: 4.5 mmol/L (ref 3.5–5.2)
Sodium: 140 mmol/L (ref 134–144)
Total Protein: 6.7 g/dL (ref 6.0–8.5)

## 2017-05-29 LAB — CBC WITH DIFFERENTIAL/PLATELET
BASOS: 0 %
Basophils Absolute: 0 10*3/uL (ref 0.0–0.2)
EOS (ABSOLUTE): 0.1 10*3/uL (ref 0.0–0.4)
EOS: 1 %
HEMATOCRIT: 36 % (ref 34.0–46.6)
Hemoglobin: 12.1 g/dL (ref 11.1–15.9)
IMMATURE GRANULOCYTES: 0 %
Immature Grans (Abs): 0 10*3/uL (ref 0.0–0.1)
Lymphocytes Absolute: 1.6 10*3/uL (ref 0.7–3.1)
Lymphs: 30 %
MCH: 29 pg (ref 26.6–33.0)
MCHC: 33.6 g/dL (ref 31.5–35.7)
MCV: 86 fL (ref 79–97)
MONOS ABS: 0.5 10*3/uL (ref 0.1–0.9)
Monocytes: 9 %
NEUTROS PCT: 60 %
Neutrophils Absolute: 3.2 10*3/uL (ref 1.4–7.0)
PLATELETS: 271 10*3/uL (ref 150–379)
RBC: 4.17 x10E6/uL (ref 3.77–5.28)
RDW: 14 % (ref 12.3–15.4)
WBC: 5.4 10*3/uL (ref 3.4–10.8)

## 2017-05-29 LAB — LIPID PANEL
Chol/HDL Ratio: 3.7 ratio (ref 0.0–4.4)
Cholesterol, Total: 224 mg/dL — ABNORMAL HIGH (ref 100–199)
HDL: 61 mg/dL (ref >39)
LDL Calculated: 142 mg/dL — ABNORMAL HIGH (ref 0–99)
TRIGLYCERIDES: 106 mg/dL (ref 0–149)
VLDL Cholesterol Cal: 21 mg/dL (ref 5–40)

## 2017-05-29 LAB — HEMOGLOBIN A1C
ESTIMATED AVERAGE GLUCOSE: 111 mg/dL
HEMOGLOBIN A1C: 5.5 % (ref 4.8–5.6)

## 2017-05-29 LAB — TSH: TSH: 2.79 u[IU]/mL (ref 0.450–4.500)

## 2017-05-29 NOTE — Telephone Encounter (Signed)
Pt advised and agrees with treatment plan. 

## 2017-05-29 NOTE — Telephone Encounter (Signed)
-----   Message from Mar Daring, [place] sent at 05/29/2017  8:17 AM EDT ----- Cholesterol is still borderline high but is improved since labs last year. Continue healthy lifestyle modifications with dieting and exercise. All other labs are normal.

## 2017-05-30 LAB — PAP IG AND HPV HIGH-RISK
HPV, high-risk: NEGATIVE
PAP Smear Comment: 0

## 2017-06-07 ENCOUNTER — Ambulatory Visit
Admission: RE | Admit: 2017-06-07 | Discharge: 2017-06-07 | Disposition: A | Discharge status: Home / Self Care | Payer: BC Managed Care – PPO | Source: Ambulatory Visit | Attending: Physician Assistant | Admitting: Physician Assistant

## 2017-06-07 DIAGNOSIS — Z1231 Encounter for screening mammogram for malignant neoplasm of breast: Secondary | ICD-10-CM | POA: Insufficient documentation

## 2017-06-07 DIAGNOSIS — Z1239 Encounter for other screening for malignant neoplasm of breast: Secondary | ICD-10-CM

## 2017-08-27 ENCOUNTER — Telehealth: Payer: Self-pay | Admitting: Gastroenterology

## 2017-08-27 NOTE — Telephone Encounter (Signed)
Please call after 4:00 to schedule colonoscopy. 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

## 2017-08-29 ENCOUNTER — Other Ambulatory Visit: Payer: Self-pay

## 2017-08-29 ENCOUNTER — Telehealth: Payer: Self-pay

## 2017-08-29 DIAGNOSIS — Z800 Family history of malignant neoplasm of digestive organs: Secondary | ICD-10-CM

## 2017-08-29 NOTE — Telephone Encounter (Signed)
Returned patients call to schedule colonoscopy.  LVM.  Thanks Eugenio Dollins 

## 2017-08-29 NOTE — Telephone Encounter (Signed)
Gastroenterology Pre-Procedure Review  Request Date: 10/12/17 Requesting Physician: Dr. Allen Norris  PATIENT REVIEW QUESTIONS: The patient responded to the following health history questions as indicated:    1. Are you having any GI issues? no 2. Do you have a personal history of Polyps? no 3. Do you have a family history of Colon Cancer or Polyps? yes (Brother Colon Cancer) 4. Diabetes Mellitus? no 5. Joint replacements in the past 12 months?no 6. Major health problems in the past 3 months?no 7. Any artificial heart valves, MVP, or defibrillator?no    MEDICATIONS & ALLERGIES:    Patient reports the following regarding taking any anticoagulation/antiplatelet therapy:   Plavix, Coumadin, Eliquis, Xarelto, Lovenox, Pradaxa, Brilinta, or Effient? no Aspirin? no  Patient confirms/reports the following medications:  Current Outpatient Prescriptions  Medication Sig Dispense Refill  . aspirin (ASPIRIN ADULT LOW STRENGTH) 81 MG EC tablet Take by mouth.    . Cholecalciferol (VITAMIN D PO) Take by mouth.    . Misc Natural Products (GRAPE SEED COMPLEX) CAPS Take by mouth.    . Omega-3 Fatty Acids (FISH OIL MAXIMUM STRENGTH) 1200 MG CAPS Take by mouth.    . TURMERIC PO Take by mouth.     No current facility-administered medications for this visit.     Patient confirms/reports the following allergies:  No Known Allergies  No orders of the defined types were placed in this encounter.   AUTHORIZATION INFORMATION Primary Insurance: 1D#: Group #:  Secondary Insurance: 1D#: Group #:  SCHEDULE INFORMATION: Date: 10/12/17 Time: Location:[facility]

## 2017-10-09 ENCOUNTER — Other Ambulatory Visit: Payer: Self-pay

## 2017-10-09 ENCOUNTER — Encounter: Payer: Self-pay | Admitting: *Deleted

## 2017-10-11 NOTE — Discharge Instructions (Signed)
General Anesthesia, Adult, Care After °These instructions provide you with information about caring for yourself after your procedure. Your health care provider may also give you more specific instructions. Your treatment has been planned according to current medical practices, but problems sometimes occur. Call your health care provider if you have any problems or questions after your procedure. °What can I expect after the procedure? °After the procedure, it is common to have: °· Vomiting. °· A sore throat. °· Mental slowness. ° °It is common to feel: °· Nauseous. °· Cold or shivery. °· Sleepy. °· Tired. °· Sore or achy, even in parts of your body where you did not have surgery. ° °Follow these instructions at home: °For at least 24 hours after the procedure: °· Do not: °? Participate in activities where you could fall or become injured. °? Drive. °? Use heavy machinery. °? Drink alcohol. °? Take sleeping pills or medicines that cause drowsiness. °? Make important decisions or sign legal documents. °? Take care of children on your own. °· Rest. °Eating and drinking °· If you vomit, drink water, juice, or soup when you can drink without vomiting. °· Drink enough fluid to keep your urine clear or pale yellow. °· Make sure you have little or no nausea before eating solid foods. °· Follow the diet recommended by your health care provider. °General instructions °· Have a responsible adult stay with you until you are awake and alert. °· Return to your normal activities as told by your health care provider. Ask your health care provider what activities are safe for you. °· Take over-the-counter and prescription medicines only as told by your health care provider. °· If you smoke, do not smoke without supervision. °· Keep all follow-up visits as told by your health care provider. This is important. °Contact a health care provider if: °· You continue to have nausea or vomiting at home, and medicines are not helpful. °· You  cannot drink fluids or start eating again. °· You cannot urinate after 8-12 hours. °· You develop a skin rash. °· You have fever. °· You have increasing redness at the site of your procedure. °Get help right away if: °· You have difficulty breathing. °· You have chest pain. °· You have unexpected bleeding. °· You feel that you are having a life-threatening or urgent problem. °This information is not intended to replace advice given to you by your health care provider. Make sure you discuss any questions you have with your health care provider. °Document Released: 01/22/2001 Document Revised: 03/20/2016 Document Reviewed: 09/30/2015 °Elsevier Interactive Patient Education © 2018 Elsevier Inc. ° °

## 2017-10-12 ENCOUNTER — Ambulatory Visit: Payer: BC Managed Care – PPO | Admitting: Anesthesiology

## 2017-10-12 ENCOUNTER — Ambulatory Visit
Admission: RE | Admit: 2017-10-12 | Discharge: 2017-10-12 | Disposition: A | Discharge status: Home / Self Care | Payer: BC Managed Care – PPO | Source: Ambulatory Visit | Attending: Gastroenterology | Admitting: Gastroenterology

## 2017-10-12 ENCOUNTER — Encounter
Admission: RE | Disposition: A | Discharge status: Home / Self Care | Payer: Self-pay | Source: Ambulatory Visit | Attending: Gastroenterology

## 2017-10-12 DIAGNOSIS — K640 First degree hemorrhoids: Secondary | ICD-10-CM | POA: Diagnosis not present

## 2017-10-12 DIAGNOSIS — E559 Vitamin D deficiency, unspecified: Secondary | ICD-10-CM | POA: Diagnosis not present

## 2017-10-12 DIAGNOSIS — E785 Hyperlipidemia, unspecified: Secondary | ICD-10-CM | POA: Diagnosis not present

## 2017-10-12 DIAGNOSIS — Z1211 Encounter for screening for malignant neoplasm of colon: Secondary | ICD-10-CM | POA: Diagnosis present

## 2017-10-12 DIAGNOSIS — M1990 Unspecified osteoarthritis, unspecified site: Secondary | ICD-10-CM | POA: Insufficient documentation

## 2017-10-12 DIAGNOSIS — Z800 Family history of malignant neoplasm of digestive organs: Secondary | ICD-10-CM | POA: Diagnosis not present

## 2017-10-12 HISTORY — DX: Presence of spectacles and contact lenses: Z97.3

## 2017-10-12 HISTORY — PX: COLONOSCOPY WITH PROPOFOL: SHX5780

## 2017-10-12 HISTORY — DX: Motion sickness, initial encounter: T75.3XXA

## 2017-10-12 SURGERY — COLONOSCOPY WITH PROPOFOL
Anesthesia: General | Wound class: Contaminated

## 2017-10-12 MED ORDER — LIDOCAINE HCL (CARDIAC) 20 MG/ML IV SOLN
INTRAVENOUS | Status: DC | PRN
  Administered 2017-10-12: 40 mg via INTRAVENOUS

## 2017-10-12 MED ORDER — STERILE WATER FOR IRRIGATION IR SOLN
Status: DC | PRN
  Administered 2017-10-12: 10:00:00

## 2017-10-12 MED ORDER — LACTATED RINGERS IV SOLN
INTRAVENOUS | Status: DC
  Administered 2017-10-12: 10:00:00 via INTRAVENOUS

## 2017-10-12 MED ORDER — PROPOFOL 10 MG/ML IV BOLUS
INTRAVENOUS | Status: DC | PRN
  Administered 2017-10-12: 30 mg via INTRAVENOUS
  Administered 2017-10-12 (×4): 20 mg via INTRAVENOUS
  Administered 2017-10-12: 30 mg via INTRAVENOUS
  Administered 2017-10-12: 100 mg via INTRAVENOUS
  Administered 2017-10-12: 20 mg via INTRAVENOUS
  Administered 2017-10-12: 10 mg via INTRAVENOUS

## 2017-10-12 SURGICAL SUPPLY — 23 items

## 2017-10-12 NOTE — Anesthesia Preprocedure Evaluation (Signed)
Anesthesia Evaluation  Patient identified by MRN, date of birth, ID band Patient awake    Reviewed: Allergy & Precautions, H&P , NPO status , Patient's Chart, lab work & pertinent test results, reviewed documented beta blocker date and time   Airway Mallampati: I  TM Distance: >3 FB Neck ROM: full    Dental no notable dental hx.    Pulmonary neg pulmonary ROS,    Pulmonary exam normal breath sounds clear to auscultation       Cardiovascular Exercise Tolerance: Good negative cardio ROS   Rhythm:regular Rate:Normal     Neuro/Psych negative neurological ROS  negative psych ROS   GI/Hepatic negative GI ROS, Neg liver ROS,   Endo/Other  negative endocrine ROS  Renal/GU negative Renal ROS  negative genitourinary   Musculoskeletal   Abdominal   Peds  Hematology  (+) anemia ,   Anesthesia Other Findings   Reproductive/Obstetrics negative OB ROS                             Anesthesia Physical Anesthesia Plan  ASA: II  Anesthesia Plan: General   Post-op Pain Management:    Induction:   PONV Risk Score and Plan: 3 and Propofol infusion  Airway Management Planned:   Additional Equipment:   Intra-op Plan:   Post-operative Plan:   Informed Consent: I have reviewed the patients History and Physical, chart, labs and discussed the procedure including the risks, benefits and alternatives for the proposed anesthesia with the patient or authorized representative who has indicated his/her understanding and acceptance.     Plan Discussed with: CRNA  Anesthesia Plan Comments:         Anesthesia Quick Evaluation

## 2017-10-12 NOTE — Transfer of Care (Signed)
Immediate Anesthesia Transfer of Care Note  Patient: Rebekah [name]  Procedure(s) Performed: COLONOSCOPY WITH PROPOFOL (N/A )  Patient Location: PACU  Anesthesia Type: General  Level of Consciousness: awake, alert  and patient cooperative  Airway and Oxygen Therapy: Patient Spontanous Breathing and Patient connected to supplemental oxygen  Post-op Assessment: Post-op Vital signs reviewed, Patient's Cardiovascular Status Stable, Respiratory Function Stable, Patent Airway and No signs of Nausea or vomiting  Post-op Vital Signs: Reviewed and stable  Complications: No apparent anesthesia complications

## 2017-10-12 NOTE — Op Note (Addendum)
[facility] Gastroenterology Patient Name: Rebekah [name] Procedure Date: 10/12/2017 10:16 AM MRN: [id number] Account #: [id number] Date of Birth: 08/24/1961 Admit Type: Outpatient Age: 56 Room: [facility] OR ROOM 01 Gender: Female Note Status: Finalized Procedure:            Colonoscopy Indications:          Family history of colon cancer in a first-degree                        relative Providers:            Lucilla Lame MD, MD Referring MD:         Mar Daring (Referring MD) Medicines:            Propofol per Anesthesia Complications:        No immediate complications. Procedure:            Pre-Anesthesia Assessment:                       - Prior to the procedure, a History and Physical was                        performed, and patient medications and allergies were                        reviewed. The patient's tolerance of previous                        anesthesia was also reviewed. The risks and benefits of                        the procedure and the sedation options and risks were                        discussed with the patient. All questions were                        answered, and informed consent was obtained. Prior                        Anticoagulants: The patient has taken no previous                        anticoagulant or antiplatelet agents. ASA Grade                        Assessment: II - A patient with mild systemic disease.                        After reviewing the risks and benefits, the patient was                        deemed in satisfactory condition to undergo the                        procedure.                       After obtaining informed consent, the colonoscope was  passed under direct vision. Throughout the procedure,                        the patient's blood pressure, pulse, and oxygen                        saturations were monitored continuously. The Olympus                        CF-HQ190L  Colonoscope (S#. [id number]) was introduced                        through the anus and advanced to the the cecum,                        identified by appendiceal orifice and ileocecal valve.                        The colonoscopy was performed without difficulty. The                        patient tolerated the procedure well. The quality of                        the bowel preparation was excellent. Findings:      The perianal and digital rectal examinations were normal.      Non-bleeding internal hemorrhoids were found during retroflexion. The       hemorrhoids were Grade I (internal hemorrhoids that do not prolapse). Impression:           - Non-bleeding internal hemorrhoids.                       - No specimens collected. Recommendation:       - Discharge patient to home.                       - Resume previous diet.                       - Continue present medications.                       - Repeat colonoscopy in 5 years for surveillance. Procedure Code(s):    --- Professional ---                       [phone], Colonoscopy, flexible; diagnostic, including                        collection of specimen(s) by brushing or washing, when                        performed (separate procedure) Diagnosis Code(s):    --- Professional ---                       Z80.0, Family history of malignant neoplasm of                        digestive organs CPT copyright 2016 American Medical Association. All rights reserved. The codes documented in this report are preliminary and upon coder review  may  be revised to meet current compliance requirements. Lucilla Lame MD, MD 10/12/2017 10:44:44 AM This report has been signed electronically. Number of Addenda: 0 Note Initiated On: 10/12/2017 10:16 AM Scope Withdrawal Time: 0 hours 7 minutes 20 seconds  Total Procedure Duration: 0 hours 11 minutes 40 seconds       [facility]

## 2017-10-12 NOTE — Anesthesia Procedure Notes (Signed)
Procedure Name: MAC Date/Time: 10/12/2017 10:25 AM Performed by: Janna Arch, CRNA Pre-anesthesia Checklist: Patient identified, Emergency Drugs available, Suction available and Patient being monitored Patient Re-evaluated:Patient Re-evaluated prior to induction Oxygen Delivery Method: Nasal cannula

## 2017-10-12 NOTE — Anesthesia Postprocedure Evaluation (Signed)
Anesthesia Post Note  Patient: Rebekah [name]  Procedure(s) Performed: COLONOSCOPY WITH PROPOFOL (N/A )  Patient location during evaluation: PACU Anesthesia Type: General Level of consciousness: awake and alert Pain management: pain level controlled Vital Signs Assessment: post-procedure vital signs reviewed and stable Respiratory status: spontaneous breathing, nonlabored ventilation, respiratory function stable and patient connected to nasal cannula oxygen Cardiovascular status: blood pressure returned to baseline and stable Postop Assessment: no apparent nausea or vomiting Anesthetic complications: no    DANIEL D KOVACS

## 2017-10-12 NOTE — H&P (Signed)
Lucilla Lame, MD [facility] [address] [place], [place] [address] Phone:[phone] Fax : [phone]  Primary Care Physician:  Mar Daring, PA-C Primary Gastroenterologist:  Dr. Allen Norris  Pre-Procedure History & Physical: HPI:  Rebekah [name] is a 56 y.o. female is here for an colonoscopy.   Past Medical History:  Diagnosis Date  . Anemia   . Arthritis   . Hyperlipidemia   . Motion sickness    back seat of cars  . Varicose veins   . Vitamin D deficiency   . Vitiligo   . Wears contact lenses     Past Surgical History:  Procedure Laterality Date  . COLONOSCOPY  09/19/2012  . COLPOSCOPY  2000    Prior to Admission medications   Medication Sig Start Date End Date Taking? Authorizing Provider  Cholecalciferol (VITAMIN D PO) Take by mouth.   Yes [provider]  Misc Natural Products ([place]) CAPS Take by mouth.   Yes [provider]  Multiple Vitamins-Minerals (HAIR/SKIN/NAILS/BIOTIN PO) Take by mouth daily.   Yes [provider]  Omega-3 Fatty Acids (FISH OIL MAXIMUM STRENGTH) 1200 MG CAPS Take by mouth. 05/15/14  Yes [provider]  TURMERIC PO Take by mouth.   Yes [provider]  aspirin (ASPIRIN ADULT LOW STRENGTH) 81 MG EC tablet Take by mouth. 05/15/14   [provider]    Allergies as of 08/29/2017  . (No Known Allergies)    Family History  Problem Relation Age of Onset  . Cancer Father        lung cancer  . Melanoma Father   . Hypertension Father   . Cancer Brother        colon cancer  . Alcohol abuse Brother   . Breast cancer Neg Hx     Social History   Socioeconomic History  . Marital status: Married    Spouse name: Not on file  . Number of children: 3  . Years of education: 66  . Highest education level: Not on file  Social Needs  . Financial resource strain: Not on file  . Food insecurity - worry: Not on file  . Food insecurity - inability: Not on file  .  Transportation needs - medical: Not on file  . Transportation needs - non-medical: Not on file  Occupational History  . Occupation: Associate Professor: [employer]    Comment: [employer]  Tobacco Use  . Smoking status: Never Smoker  . Smokeless tobacco: Never Used  Substance and Sexual Activity  . Alcohol use: Yes    Alcohol/week: 0.6 oz    Types: 1 Glasses of wine per week  . Drug use: No  . Sexual activity: Not on file  Other Topics Concern  . Not on file  Social History Narrative  . Not on file    Review of Systems: See HPI, otherwise negative ROS  Physical Exam: BP 116/77   Pulse 65   Temp 98.1 F (36.7 C) (Tympanic)   Resp 16   Ht 5\' 7"  (1.702 m)   Wt 185 lb (83.9 kg)   SpO2 99%   BMI 28.98 kg/m  General:   Alert,  pleasant and cooperative in NAD Head:  Normocephalic and atraumatic. Neck:  Supple; no masses or thyromegaly. Lungs:  Clear throughout to auscultation.    Heart:  Regular rate and rhythm. Abdomen:  Soft, nontender and nondistended. Normal bowel sounds, without guarding, and without rebound.  Neurologic:  Alert and  oriented x4;  grossly normal neurologically.  Impression/Plan: Rebekah [name] is here for an colonoscopy to be performed for family history of colon cancer  Risks, benefits, limitations, and alternatives regarding  colonoscopy have been reviewed with the patient.  Questions have been answered.  All parties agreeable.   Lucilla Lame, MD  10/12/2017, 9:54 AM

## 2017-10-15 ENCOUNTER — Encounter: Payer: Self-pay | Admitting: Gastroenterology

## 2018-05-28 ENCOUNTER — Encounter: Payer: Self-pay | Admitting: Physician Assistant

## 2018-05-28 ENCOUNTER — Ambulatory Visit (INDEPENDENT_AMBULATORY_CARE_PROVIDER_SITE_OTHER): Payer: BC Managed Care – PPO | Admitting: Physician Assistant

## 2018-05-28 VITALS — BP 110/82 | HR 78 | Temp 98.4°F | Resp 16 | Ht 67.0 in | Wt 188.0 lb

## 2018-05-28 DIAGNOSIS — Z6829 Body mass index (BMI) 29.0-29.9, adult: Secondary | ICD-10-CM

## 2018-05-28 DIAGNOSIS — Z1231 Encounter for screening mammogram for malignant neoplasm of breast: Secondary | ICD-10-CM

## 2018-05-28 DIAGNOSIS — E7800 Pure hypercholesterolemia, unspecified: Secondary | ICD-10-CM | POA: Diagnosis not present

## 2018-05-28 DIAGNOSIS — E559 Vitamin D deficiency, unspecified: Secondary | ICD-10-CM

## 2018-05-28 DIAGNOSIS — Z1159 Encounter for screening for other viral diseases: Secondary | ICD-10-CM

## 2018-05-28 DIAGNOSIS — Z0000 Encounter for general adult medical examination without abnormal findings: Secondary | ICD-10-CM | POA: Diagnosis not present

## 2018-05-28 DIAGNOSIS — Z114 Encounter for screening for human immunodeficiency virus [HIV]: Secondary | ICD-10-CM

## 2018-05-28 DIAGNOSIS — Z1239 Encounter for other screening for malignant neoplasm of breast: Secondary | ICD-10-CM

## 2018-05-28 NOTE — Patient Instructions (Signed)
Health Maintenance for Postmenopausal Women Menopause is a normal process in which your reproductive ability comes to an end. This process happens gradually over a span of months to years, usually between the ages of 22 and 9. Menopause is complete when you have missed 12 consecutive menstrual periods. It is important to talk with your health care provider about some of the most common conditions that affect postmenopausal women, such as heart disease, cancer, and bone loss (osteoporosis). Adopting a healthy lifestyle and getting preventive care can help to promote your health and wellness. Those actions can also lower your chances of developing some of these common conditions. What should I know about menopause? During menopause, you may experience a number of symptoms, such as:  Moderate-to-severe hot flashes.  Night sweats.  Decrease in sex drive.  Mood swings.  Headaches.  Tiredness.  Irritability.  Memory problems.  Insomnia.  Choosing to treat or not to treat menopausal changes is an individual decision that you make with your health care provider. What should I know about hormone replacement therapy and supplements? Hormone therapy products are effective for treating symptoms that are associated with menopause, such as hot flashes and night sweats. Hormone replacement carries certain risks, especially as you become older. If you are thinking about using estrogen or estrogen with progestin treatments, discuss the benefits and risks with your health care provider. What should I know about heart disease and stroke? Heart disease, heart attack, and stroke become more likely as you age. This may be due, in part, to the hormonal changes that your body experiences during menopause. These can affect how your body processes dietary fats, triglycerides, and cholesterol. Heart attack and stroke are both medical emergencies. There are many things that you can do to help prevent heart disease  and stroke:  Have your blood pressure checked at least every 1-2 years. High blood pressure causes heart disease and increases the risk of stroke.  If you are 53-22 years old, ask your health care provider if you should take aspirin to prevent a heart attack or a stroke.  Do not use any tobacco products, including cigarettes, chewing tobacco, or electronic cigarettes. If you need help quitting, ask your health care provider.  It is important to eat a healthy diet and maintain a healthy weight. ? Be sure to include plenty of vegetables, fruits, low-fat dairy products, and lean protein. ? Avoid eating foods that are high in solid fats, added sugars, or salt (sodium).  Get regular exercise. This is one of the most important things that you can do for your health. ? Try to exercise for at least 150 minutes each week. The type of exercise that you do should increase your heart rate and make you sweat. This is known as moderate-intensity exercise. ? Try to do strengthening exercises at least twice each week. Do these in addition to the moderate-intensity exercise.  Know your numbers.Ask your health care provider to check your cholesterol and your blood glucose. Continue to have your blood tested as directed by your health care provider.  What should I know about cancer screening? There are several types of cancer. Take the following steps to reduce your risk and to catch any cancer development as early as possible. Breast Cancer  Practice breast self-awareness. ? This means understanding how your breasts normally appear and feel. ? It also means doing regular breast self-exams. Let your health care provider know about any changes, no matter how small.  If you are 40  or older, have a clinician do a breast exam (clinical breast exam or CBE) every year. Depending on your age, family history, and medical history, it may be recommended that you also have a yearly breast X-ray (mammogram).  If you  have a family history of breast cancer, talk with your health care provider about genetic screening.  If you are at high risk for breast cancer, talk with your health care provider about having an MRI and a mammogram every year.  Breast cancer (BRCA) gene test is recommended for women who have family members with BRCA-related cancers. Results of the assessment will determine the need for genetic counseling and BRCA1 and for BRCA2 testing. BRCA-related cancers include these types: ? Breast. This occurs in males or females. ? Ovarian. ? Tubal. This may also be called fallopian tube cancer. ? Cancer of the abdominal or pelvic lining (peritoneal cancer). ? Prostate. ? Pancreatic.  Cervical, Uterine, and Ovarian Cancer Your health care provider may recommend that you be screened regularly for cancer of the pelvic organs. These include your ovaries, uterus, and vagina. This screening involves a pelvic exam, which includes checking for microscopic changes to the surface of your cervix (Pap test).  For women ages 21-65, health care providers may recommend a pelvic exam and a Pap test every three years. For women ages 79-65, they may recommend the Pap test and pelvic exam, combined with testing for human papilloma virus (HPV), every five years. Some types of HPV increase your risk of cervical cancer. Testing for HPV may also be done on women of any age who have unclear Pap test results.  Other health care providers may not recommend any screening for nonpregnant women who are considered low risk for pelvic cancer and have no symptoms. Ask your health care provider if a screening pelvic exam is right for you.  If you have had past treatment for cervical cancer or a condition that could lead to cancer, you need Pap tests and screening for cancer for at least 20 years after your treatment. If Pap tests have been discontinued for you, your risk factors (such as having a new sexual partner) need to be  reassessed to determine if you should start having screenings again. Some women have medical problems that increase the chance of getting cervical cancer. In these cases, your health care provider may recommend that you have screening and Pap tests more often.  If you have a family history of uterine cancer or ovarian cancer, talk with your health care provider about genetic screening.  If you have vaginal bleeding after reaching menopause, tell your health care provider.  There are currently no reliable tests available to screen for ovarian cancer.  Lung Cancer Lung cancer screening is recommended for adults 69-62 years old who are at high risk for lung cancer because of a history of smoking. A yearly low-dose CT scan of the lungs is recommended if you:  Currently smoke.  Have a history of at least 30 pack-years of smoking and you currently smoke or have quit within the past 15 years. A pack-year is smoking an average of one pack of cigarettes per day for one year.  Yearly screening should:  Continue until it has been 15 years since you quit.  Stop if you develop a health problem that would prevent you from having lung cancer treatment.  Colorectal Cancer  This type of cancer can be detected and can often be prevented.  Routine colorectal cancer screening usually begins at  age 42 and continues through age 45.  If you have risk factors for colon cancer, your health care provider may recommend that you be screened at an earlier age.  If you have a family history of colorectal cancer, talk with your health care provider about genetic screening.  Your health care provider may also recommend using home test kits to check for hidden blood in your stool.  A small camera at the end of a tube can be used to examine your colon directly (sigmoidoscopy or colonoscopy). This is done to check for the earliest forms of colorectal cancer.  Direct examination of the colon should be repeated every  5-10 years until age 71. However, if early forms of precancerous polyps or small growths are found or if you have a family history or genetic risk for colorectal cancer, you may need to be screened more often.  Skin Cancer  Check your skin from head to toe regularly.  Monitor any moles. Be sure to tell your health care provider: ? About any new moles or changes in moles, especially if there is a change in a mole's shape or color. ? If you have a mole that is larger than the size of a pencil eraser.  If any of your family members has a history of skin cancer, especially at a young age, talk with your health care provider about genetic screening.  Always use sunscreen. Apply sunscreen liberally and repeatedly throughout the day.  Whenever you are outside, protect yourself by wearing long sleeves, pants, a wide-brimmed hat, and sunglasses.  What should I know about osteoporosis? Osteoporosis is a condition in which bone destruction happens more quickly than new bone creation. After menopause, you may be at an increased risk for osteoporosis. To help prevent osteoporosis or the bone fractures that can happen because of osteoporosis, the following is recommended:  If you are 46-71 years old, get at least 1,000 mg of calcium and at least 600 mg of vitamin D per day.  If you are older than age 55 but younger than age 65, get at least 1,200 mg of calcium and at least 600 mg of vitamin D per day.  If you are older than age 54, get at least 1,200 mg of calcium and at least 800 mg of vitamin D per day.  Smoking and excessive alcohol intake increase the risk of osteoporosis. Eat foods that are rich in calcium and vitamin D, and do weight-bearing exercises several times each week as directed by your health care provider. What should I know about how menopause affects my mental health? Depression may occur at any age, but it is more common as you become older. Common symptoms of depression  include:  Low or sad mood.  Changes in sleep patterns.  Changes in appetite or eating patterns.  Feeling an overall lack of motivation or enjoyment of activities that you previously enjoyed.  Frequent crying spells.  Talk with your health care provider if you think that you are experiencing depression. What should I know about immunizations? It is important that you get and maintain your immunizations. These include:  Tetanus, diphtheria, and pertussis (Tdap) booster vaccine.  Influenza every year before the flu season begins.  Pneumonia vaccine.  Shingles vaccine.  Your health care provider may also recommend other immunizations. This information is not intended to replace advice given to you by your health care provider. Make sure you discuss any questions you have with your health care provider. Document Released: 12/08/2005  Document Revised: 05/05/2016 Document Reviewed: 07/20/2015 Elsevier Interactive Patient Education  2018 Elsevier Inc.  

## 2018-05-28 NOTE — Progress Notes (Signed)
Patient: Rebekah [name], Female    DOB: 08-Jul-1961, 57 y.o.   MRN: [id number] Visit Date: 05/28/2018  Today's Provider: Mar Daring, PA-C   I, Martha Clan, CMA, am acting as scribe for [employer], PA-C.  Chief Complaint  Patient presents with  . Annual Exam   Subjective:    Annual physical exam Rebekah [name] is a 57 y.o. female who presents today for health maintenance and complete physical. She feels well. She reports exercising 3-4 times per week (yoga, etc), plus walking her dogs daily for 30 minutes. She reports she is sleeping well.  Pt is asking if she should take a daily 81 mg ASA daily.  Last CPE- 05/28/2017 Last pap- 05/28/2017- NIL; HPV negative Last mammogram- 06/07/2017- BI-RADS 1 Last colonoscopy- 10/12/2017- non-bleeding internal hemorrhoids. Repeat 5 years due to family H/O colon cancer. -----------------------------------------------------------------   Review of Systems  Constitutional: Negative.   HENT: Negative.   Eyes: Negative.   Respiratory: Negative.   Cardiovascular: Negative.   Gastrointestinal: Negative.   Endocrine: Negative.   Genitourinary: Negative.   Musculoskeletal: Positive for myalgias. Negative for arthralgias, back pain, gait problem, joint swelling, neck pain and neck stiffness.  Skin: Negative.   Allergic/Immunologic: Negative.   Neurological: Negative.   Hematological: Negative.   Psychiatric/Behavioral: Negative.     Social History      She  reports that she has never smoked. She has never used smokeless tobacco. She reports that she drinks about 0.6 oz of alcohol per week. She reports that she does not use drugs.       Social History   Socioeconomic History  . Marital status: Married    Spouse name: Not on file  . Number of children: 3  . Years of education: 22  . Highest education level: Not on file  Occupational History  . Occupation: Associate Professor: [employer]    Comment: [place]  . Financial resource strain: Not on file  . Food insecurity:    Worry: Not on file    Inability: Not on file  . Transportation needs:    Medical: Not on file    Non-medical: Not on file  Tobacco Use  . Smoking status: Never Smoker  . Smokeless tobacco: Never Used  Substance and Sexual Activity  . Alcohol use: Yes    Alcohol/week: 0.6 oz    Types: 1 Glasses of wine per week  . Drug use: No  . Sexual activity: Not on file  Lifestyle  . Physical activity:    Days per week: Not on file    Minutes per session: Not on file  . Stress: Not on file  Relationships  . Social connections:    Talks on phone: Not on file    Gets together: Not on file    Attends religious service: Not on file    Active member of club or organization: Not on file    Attends meetings of clubs or organizations: Not on file    Relationship status: Not on file  Other Topics Concern  . Not on file  Social History Narrative  . Not on file    Past Medical History:  Diagnosis Date  . Anemia   . Arthritis   . Hyperlipidemia   . Motion sickness    back seat of cars  . Varicose veins   . Vitamin D deficiency   . Vitiligo   .  Wears contact lenses      Patient Active Problem List   Diagnosis Date Noted  . Family history of malignant neoplasm of gastrointestinal tract   . Abnormal ECG 05/24/2016  . Absolute anemia 05/24/2016  . Arthritis 05/24/2016  . Accumulation of fluid in tissues 05/24/2016  . 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 Vitiligo 05/24/2016  . Avitaminosis D 05/27/2009  . Hypercholesterolemia without hypertriglyceridemia 05/13/2009  . Breast lump 01/10/2006    Past Surgical History:  Procedure Laterality Date  . COLONOSCOPY  09/19/2012  . COLONOSCOPY WITH PROPOFOL N/A 10/12/2017   Procedure: COLONOSCOPY WITH PROPOFOL;  Surgeon: Lucilla Lame, MD;  Location: [place];  Service: Endoscopy;  Laterality: N/A;  . COLPOSCOPY  2000    Family History         Family Status  Relation Name Status  . Mother  Deceased  . Father  Deceased  . Brother  Deceased  . Neg Hx  (Not Specified)        Her family history includes Alcohol abuse in her brother; Cancer in her brother and father; Hypertension in her father; Melanoma in her father. There is no history of Breast cancer.      No Known Allergies   Current Outpatient Medications:  .  Cholecalciferol (VITAMIN D PO), Take by mouth., Disp: , Rfl:  .  Misc Natural Products (GRAPE SEED COMPLEX) CAPS, Take by mouth., Disp: , Rfl:  .  Multiple Vitamins-Minerals (HAIR/SKIN/NAILS/BIOTIN PO), Take by mouth daily., Disp: , Rfl:  .  Omega-3 Fatty Acids (FISH OIL MAXIMUM STRENGTH) 1200 MG CAPS, Take by mouth., Disp: , Rfl:  .  TURMERIC PO, Take by mouth., Disp: , Rfl:  .  aspirin (ASPIRIN ADULT LOW STRENGTH) 81 MG EC tablet, Take by mouth., Disp: , Rfl:    Patient Care Team: Mar Daring, PA-C as PCP - General (Family Medicine)      Objective:   Vitals: BP 110/82 (BP Location: Left Arm, Patient Position: Sitting, Cuff Size: Normal)   Pulse 78   Temp 98.4 F (36.9 C) (Oral)   Resp 16   Ht 5\' 7"  (1.702 m)   Wt 188 lb (85.3 kg)   SpO2 98%   BMI 29.44 kg/m    Vitals:   05/28/18 1411  BP: 110/82  Pulse: 78  Resp: 16  Temp: 98.4 F (36.9 C)  TempSrc: Oral  SpO2: 98%  Weight: 188 lb (85.3 kg)  Height: 5\' 7"  (1.702 m)     Physical Exam  Constitutional: She is oriented to person, place, and time. She appears well-developed and well-nourished. No distress.  HENT:  Head: Normocephalic and atraumatic.  Right Ear: Hearing, tympanic membrane, external ear and ear canal normal.  Left Ear: Hearing, tympanic membrane, external ear and ear canal normal.  Nose: Nose normal.  Mouth/Throat: Uvula is midline, oropharynx is clear and moist and mucous membranes are normal. No oropharyngeal exudate.  Eyes: Pupils are equal, round, and reactive to light. Conjunctivae and EOM are normal. Right eye  exhibits no discharge. Left eye exhibits no discharge. No scleral icterus.  Neck: Normal range of motion. Neck supple. No JVD present. Carotid bruit is not present. No tracheal deviation present. No thyromegaly present.  Cardiovascular: Normal rate, regular rhythm, normal heart sounds and intact distal pulses. Exam reveals no gallop and no friction rub.  No murmur heard. Pulmonary/Chest: Effort normal and breath sounds normal. No respiratory distress. She has no wheezes. She has no rales. She exhibits no tenderness. Right breast  exhibits no inverted nipple, no mass, no nipple discharge, no skin change and no tenderness. Left breast exhibits no inverted nipple, no mass, no nipple discharge, no skin change and no tenderness. No breast swelling, tenderness, discharge or bleeding. Breasts are symmetrical.  Abdominal: Soft. Bowel sounds are normal. She exhibits no distension and no mass. There is no tenderness. There is no rebound and no guarding.  Musculoskeletal: Normal range of motion. She exhibits no edema or tenderness.  Lymphadenopathy:    She has no cervical adenopathy.  Neurological: She is alert and oriented to person, place, and time.  Skin: Skin is warm and dry. No rash noted. She is not diaphoretic.  Psychiatric: She has a normal mood and affect. Her behavior is normal. Judgment and thought content normal.  Vitals reviewed.    Depression Screen PHQ 2/9 Scores 05/28/2018 05/28/2017  PHQ - 2 Score 0 0  PHQ- 9 Score - 0      Assessment & Plan:     Routine Health Maintenance and Physical Exam  Exercise Activities and Dietary recommendations Goals    None      Immunization History  Administered Date(s) Administered  . Influenza Split 09/05/2013  . Tdap 05/13/2009    Health Maintenance  Topic Date Due  . Hepatitis C Screening  17-Oct-1961  . HIV Screening  03/07/1974  . INFLUENZA VACCINE  05/30/2018  . TETANUS/TDAP  05/14/2019  . MAMMOGRAM  06/08/2019  . PAP SMEAR   05/28/2020  . COLONOSCOPY  10/12/2022     Discussed health benefits of physical activity, and encouraged her to engage in regular exercise appropriate for her age and condition.    1. Annual physical exam Normal physical exam today. Will check labs as below and f/u pending lab results. If labs are stable and WNL she will not need to have these rechecked for one year at her next annual physical exam. She is to call the office in the meantime if she has any acute issue, questions or concerns. - CBC w/Diff/Platelet - Comprehensive Metabolic Panel (CMET) - TSH - Lipid Profile - HgB A1c  2. Breast cancer screening Breast exam today was normal. There is no family history of breast cancer. She does perform regular self breast exams. Mammogram was ordered as below. Information for [facility] was given to patient so she may schedule her mammogram at her convenience. - MM Digital Screening; Future  3. Hypercholesterolemia without hypertriglyceridemia Diet controlled. Will check labs as below and f/u pending results. - CBC w/Diff/Platelet - Comprehensive Metabolic Panel (CMET) - Lipid Profile - HgB A1c  4. Avitaminosis D H/O this. Postmenopausal. Taking OTC supplementation. Will check labs as below and f/u pending results. - CBC w/Diff/Platelet - Vitamin D (25 hydroxy)  5. BMI 29.0-29.9,adult Counseled patient on healthy lifestyle modifications including dieting and exercise.  - CBC w/Diff/Platelet - Comprehensive Metabolic Panel (CMET) - Lipid Profile - HgB A1c  6. Encounter for hepatitis C screening test for low risk patient - HIV antibody (with reflex)  7. Screening for HIV without presence of risk factors - Hepatitis C Antibody  --------------------------------------------------------------------

## 2018-05-30 LAB — CBC WITH DIFFERENTIAL/PLATELET
Basophils Absolute: 0 10*3/uL (ref 0.0–0.2)
Basos: 1 %
EOS (ABSOLUTE): 0.2 10*3/uL (ref 0.0–0.4)
EOS: 3 %
HEMATOCRIT: 40.2 % (ref 34.0–46.6)
HEMOGLOBIN: 13.1 g/dL (ref 11.1–15.9)
Immature Grans (Abs): 0 10*3/uL (ref 0.0–0.1)
Immature Granulocytes: 0 %
LYMPHS ABS: 1.8 10*3/uL (ref 0.7–3.1)
Lymphs: 33 %
MCH: 28.9 pg (ref 26.6–33.0)
MCHC: 32.6 g/dL (ref 31.5–35.7)
MCV: 89 fL (ref 79–97)
MONOCYTES: 10 %
Monocytes Absolute: 0.5 10*3/uL (ref 0.1–0.9)
Neutrophils Absolute: 2.9 10*3/uL (ref 1.4–7.0)
Neutrophils: 53 %
Platelets: 277 10*3/uL (ref 150–450)
RBC: 4.54 x10E6/uL (ref 3.77–5.28)
RDW: 12.8 % (ref 12.3–15.4)
WBC: 5.4 10*3/uL (ref 3.4–10.8)

## 2018-05-30 LAB — COMPREHENSIVE METABOLIC PANEL
ALBUMIN: 4.4 g/dL (ref 3.5–5.5)
ALT: 16 IU/L (ref 0–32)
AST: 21 IU/L (ref 0–40)
Albumin/Globulin Ratio: 1.9 (ref 1.2–2.2)
Alkaline Phosphatase: 70 IU/L (ref 39–117)
BUN / CREAT RATIO: 31 — AB (ref 9–23)
BUN: 18 mg/dL (ref 6–24)
Bilirubin Total: 0.7 mg/dL (ref 0.0–1.2)
CALCIUM: 9.1 mg/dL (ref 8.7–10.2)
CO2: 22 mmol/L (ref 20–29)
CREATININE: 0.58 mg/dL (ref 0.57–1.00)
Chloride: 106 mmol/L (ref 96–106)
GFR, EST AFRICAN AMERICAN: 117 mL/min/{1.73_m2} (ref >59)
GFR, EST NON AFRICAN AMERICAN: 101 mL/min/{1.73_m2} (ref >59)
Globulin, Total: 2.3 g/dL (ref 1.5–4.5)
Glucose: 91 mg/dL (ref 65–99)
Potassium: 4.4 mmol/L (ref 3.5–5.2)
SODIUM: 145 mmol/L — AB (ref 134–144)
Total Protein: 6.7 g/dL (ref 6.0–8.5)

## 2018-05-30 LAB — LIPID PANEL
Chol/HDL Ratio: 4 ratio (ref 0.0–4.4)
Cholesterol, Total: 242 mg/dL — ABNORMAL HIGH (ref 100–199)
HDL: 60 mg/dL (ref >39)
LDL CALC: 158 mg/dL — AB (ref 0–99)
Triglycerides: 120 mg/dL (ref 0–149)
VLDL CHOLESTEROL CAL: 24 mg/dL (ref 5–40)

## 2018-05-30 LAB — HEPATITIS C ANTIBODY: Hep C Virus Ab: <0.1 s/co ratio (ref 0.0–0.9)

## 2018-05-30 LAB — VITAMIN D 25 HYDROXY (VIT D DEFICIENCY, FRACTURES): Vit D, 25-Hydroxy: 35.5 ng/mL (ref 30.0–100.0)

## 2018-05-30 LAB — TSH: TSH: 3.38 u[IU]/mL (ref 0.450–4.500)

## 2018-05-30 LAB — HEMOGLOBIN A1C
ESTIMATED AVERAGE GLUCOSE: 117 mg/dL
HEMOGLOBIN A1C: 5.7 % — AB (ref 4.8–5.6)

## 2018-05-30 LAB — HIV ANTIBODY (ROUTINE TESTING W REFLEX): HIV Screen 4th Generation wRfx: NONREACTIVE

## 2018-07-03 ENCOUNTER — Ambulatory Visit
Admission: RE | Admit: 2018-07-03 | Discharge: 2018-07-03 | Disposition: A | Discharge status: Home / Self Care | Payer: BC Managed Care – PPO | Source: Ambulatory Visit | Attending: Physician Assistant | Admitting: Physician Assistant

## 2018-07-03 DIAGNOSIS — Z1239 Encounter for other screening for malignant neoplasm of breast: Secondary | ICD-10-CM

## 2018-07-03 DIAGNOSIS — Z1231 Encounter for screening mammogram for malignant neoplasm of breast: Secondary | ICD-10-CM | POA: Insufficient documentation

## 2018-11-06 ENCOUNTER — Encounter: Payer: Self-pay | Admitting: Physician Assistant

## 2018-11-06 ENCOUNTER — Ambulatory Visit: Payer: BC Managed Care – PPO | Admitting: Physician Assistant

## 2018-11-06 VITALS — BP 133/85 | HR 72 | Resp 16 | Wt 194.0 lb

## 2018-11-06 DIAGNOSIS — M65311 Trigger thumb, right thumb: Secondary | ICD-10-CM

## 2018-11-06 MED ORDER — METHYLPREDNISOLONE 4 MG PO TBPK: ORAL_TABLET | ORAL | 0 refills | Status: DC

## 2018-11-06 NOTE — Progress Notes (Signed)
Patient: Rebekah [name] Female    DOB: Oct 18, 1961   58 y.o.   MRN: [id number] Visit Date: 11/06/2018  Today's Provider: Mar Daring, PA-C   Chief Complaint  Patient presents with  . Hand Pain   Subjective:     Hand Pain   The incident occurred more than 1 week ago (6-8 weeks). There was no injury mechanism. Pain location: right thumb. The quality of the pain is described as cramping and aching. The pain does not radiate. The pain is moderate. Pertinent negatives include no chest pain, muscle weakness, numbness or tingling. The symptoms are aggravated by palpation and movement. Treatments tried: ibuprofen. The treatment provided mild relief.    No Known Allergies   Current Outpatient Medications:  .  Cholecalciferol (VITAMIN D PO), Take by mouth., Disp: , Rfl:  .  IBUPROFEN PO, Take by mouth as needed., Disp: , Rfl:  .  Misc Natural Products (GRAPE SEED COMPLEX) CAPS, Take by mouth., Disp: , Rfl:  .  Multiple Vitamins-Minerals (HAIR/SKIN/NAILS/BIOTIN PO), Take by mouth daily., Disp: , Rfl:  .  Omega-3 Fatty Acids (FISH OIL MAXIMUM STRENGTH) 1200 MG CAPS, Take by mouth., Disp: , Rfl:  .  TURMERIC PO, Take by mouth., Disp: , Rfl:  .  aspirin (ASPIRIN ADULT LOW STRENGTH) 81 MG EC tablet, Take by mouth., Disp: , Rfl:   Review of Systems  Constitutional: Negative for appetite change, chills, fatigue and fever.  Respiratory: Negative for chest tightness and shortness of breath.   Cardiovascular: Negative for chest pain and palpitations.  Gastrointestinal: Negative for abdominal pain, nausea and vomiting.  Musculoskeletal: Positive for arthralgias. Negative for joint swelling.  Neurological: Negative for dizziness, tingling, weakness and numbness.    Social History   Tobacco Use  . Smoking status: Never Smoker  . Smokeless tobacco: Never Used  Substance Use Topics  . Alcohol use: Yes    Alcohol/week: 1.0 standard drinks    Types: 1 Glasses of wine per week   Comment: 1 drink 2-4 times per month      Objective:   BP 133/85 (BP Location: Right Arm, Patient Position: Sitting, Cuff Size: Large)   Pulse 72   Resp 16   Wt 194 lb (88 kg)   SpO2 98%   BMI 30.38 kg/m  Vitals:   11/06/18 1717  BP: 133/85  Pulse: 72  Resp: 16  SpO2: 98%  Weight: 194 lb (88 kg)     Physical Exam Vitals signs reviewed.  Constitutional:      Appearance: She is well-developed.  HENT:     Head: Normocephalic and atraumatic.  Neck:     Musculoskeletal: Normal range of motion and neck supple.  Pulmonary:     Effort: Pulmonary effort is normal. No respiratory distress.  Musculoskeletal:     Right hand: She exhibits decreased range of motion and tenderness. She exhibits normal capillary refill. Decreased strength noted. She exhibits thumb/finger opposition.       Hands:     Comments: Positive locking of the IP of joint of the right thumb  Psychiatric:        Behavior: Behavior normal.        Thought Content: Thought content normal.        Judgment: Judgment normal.        Assessment & Plan    1. Trigger finger of right thumb Medrol dose pak given as below. Patient desired to try oral steroids 1st before injection. Referral  placed to Dr. Peggye Ley for further evaluation and treatment. Call if worsening.  - methylPREDNISolone (MEDROL) 4 MG TBPK tablet; 6 day taper; take as directed on package instructions  Dispense: 21 tablet; Refill: 0 - Ambulatory referral to [place], PA-C  [facility]

## 2018-11-06 NOTE — Patient Instructions (Signed)
Trigger Finger    Trigger finger (stenosing tenosynovitis) is a condition that causes a finger to get stuck in a bent position. Each finger has a tough, cord-like tissue that connects muscle to bone (tendon), and each tendon is surrounded by a tunnel of tissue (tendon sheath). To move your finger, your tendon needs to slide freely through the sheath. Trigger finger happens when the tendon or the sheath thickens, making it difficult to move your finger.  Trigger finger can affect any finger or a thumb. It may affect more than one finger. Mild cases may clear up with rest and medicine. Severe cases require more treatment.  What are the causes?  Trigger finger is caused by a thickened finger tendon or tendon sheath. The cause of this thickening is not known.  What increases the risk?  The following factors may make you more likely to develop this condition:   Doing activities that require a strong grip.   Having rheumatoid arthritis, gout, or diabetes.   Being 40-60 years old.   Being a woman.  What are the signs or symptoms?  Symptoms of this condition include:   Pain when bending or straightening your finger.   Tenderness or swelling where your finger attaches to the palm of your hand.   A lump in the palm of your hand or on the inside of your finger.   Hearing a popping sound when you try to straighten your finger.   Feeling a popping, catching, or locking sensation when you try to straighten your finger.   Being unable to straighten your finger.  How is this diagnosed?  This condition is diagnosed based on your symptoms and a physical exam.  How is this treated?  This condition may be treated by:   Resting your finger and avoiding activities that make symptoms worse.   Wearing a finger splint to keep your finger in a slightly bent position.   Taking NSAIDs to relieve pain and swelling.   Injecting medicine (steroids) into the tendon sheath to reduce swelling and irritation. Injections may need to be  repeated.   Having surgery to open the tendon sheath. This may be done if other treatments do not work and you cannot straighten your finger. You may need physical therapy after surgery.  Follow these instructions at home:     Use moist heat to help reduce pain and swelling as told by your health care provider.   Rest your finger and avoid activities that make pain worse. Return to normal activities as told by your health care provider.   If you have a splint, wear it as told by your health care provider.   Take over-the-counter and prescription medicines only as told by your health care provider.   Keep all follow-up visits as told by your health care provider. This is important.  Contact a health care provider if:   Your symptoms are not improving with home care.  Summary   Trigger finger (stenosing tenosynovitis) causes your finger to get stuck in a bent position, and it can make it difficult and painful to straighten your finger.   This condition develops when a finger tendon or tendon sheath thickens.   Treatment starts with resting, wearing a splint, and taking NSAIDs.   In severe cases, surgery to open the tendon sheath may be needed.  This information is not intended to replace advice given to you by your health care provider. Make sure you discuss any questions you have with your   health care provider.  Document Released: 08/05/2004 Document Revised: 09/26/2016 Document Reviewed: 09/26/2016  Elsevier Interactive Patient Education  2019 [employer]

## 2018-11-11 ENCOUNTER — Encounter: Payer: Self-pay | Admitting: Physician Assistant

## 2018-12-02 ENCOUNTER — Telehealth: Payer: Self-pay | Admitting: Physician Assistant

## 2018-12-02 NOTE — Telephone Encounter (Signed)
Tdap shot - when was it given? How long is it good for?   Please leave a message if she cannot answer.  Let her know asap.  Grandbaby to be born soon.   Thanks, [employer]

## 2018-12-02 NOTE — Telephone Encounter (Signed)
Pts questions answered.  

## 2019-10-01 NOTE — Progress Notes (Signed)
Patient: Rebekah [name], Female    DOB: [birth year], 58 y.o.   MRN: [id number] Visit Date: 10/02/2019  Today's Provider: Mar Daring, PA-C   Chief Complaint  Patient presents with  . Annual Exam   Subjective:    I,Joseline E. Rosas,RMA am acting as a Education administrator for [employer], PA-C.    Annual physical exam [name] is a 58 y.o. female who presents today for health maintenance and complete physical. She feels well. She reports exercising. She reports she is sleeping fairly well . Last night she was awake 1-3 am. Feels she is awakening due to Rebekah [name] hands.   Reports waking up with bilateral hand numbness. Does have h/o trigger finger in the right thumb, and is now having same symptoms in the right 3rd finger. Not as severe per patient. With the thumb she went to [name] in January 2020 and had a steroid injection that resolved it. She is also having hammer toe of the 3rd toe on the left foot. Currently not problematic enough.  -----------------------------------------------------------------  05/30/2017-Pap is normal, HPV negative. Will repeat in 3-5 years. 07/05/2018-Normal mammogram. Repeat in one year 10/12/2017-Colonosopy  Wt Readings from Last 3 Encounters:  10/02/19 177 lb 12.8 oz (80.6 kg)  11/06/18 194 lb (88 kg)  05/28/18 188 lb (85.3 kg)    Review of Systems  Constitutional: Negative.   HENT: Negative.   Eyes: Negative.   Respiratory: Negative.   Cardiovascular: Negative for chest pain, palpitations and leg swelling.  Gastrointestinal: Negative.   Endocrine: Negative.   Genitourinary: Negative.   Musculoskeletal: Positive for arthralgias ("probably arthritis") and joint swelling (comes and goes; hands).  Skin:       Change in Vitiligo-placed on top left foot  Allergic/Immunologic: Negative.   Neurological: Positive for numbness ("at night" upon waking up no matter what time).  Hematological: Negative.   Psychiatric/Behavioral:  Positive for sleep disturbance.    Social History      She  reports that she has never smoked. She has never used smokeless tobacco. She reports current alcohol use of about 1.0 standard drinks of alcohol per week. She reports that she does not use drugs.       Social History   Socioeconomic History  . Marital status: Married    Spouse name: Hubbard Robinson  . Number of children: 3  . Years of education: 16  . Highest education level: Bachelor's degree (e.g., BA, AB, BS)  Occupational History  . Occupation: Associate Professor: [employer]    Comment: [employer]  . Financial resource strain: Not on file  . Food insecurity    Worry: Not on file    Inability: Not on file  . Transportation needs    Medical: Not on file    Non-medical: Not on file  Tobacco Use  . Smoking status: Never Smoker  . Smokeless tobacco: Never Used  Substance and Sexual Activity  . Alcohol use: Yes    Alcohol/week: 1.0 standard drinks    Types: 1 Glasses of wine per week    Comment: 1 drink 2-4 times per month  . Drug use: No  . Sexual activity: Not on file  Lifestyle  . Physical activity    Days per week: Not on file    Minutes per session: Not on file  . Stress: Not on file  Relationships  . Social connections    Talks on phone: Not on  file    Gets together: Not on file    Attends religious service: Not on file    Active member of club or organization: Not on file    Attends meetings of clubs or organizations: Not on file    Relationship status: Not on file  Other Topics Concern  . Not on file  Social History Narrative  . Not on file    Past Medical History:  Diagnosis Date  . Anemia   . Arthritis   . Hyperlipidemia   . Motion sickness    back seat of cars  . Varicose veins   . Vitamin D deficiency   . Vitiligo   . Wears contact lenses      Patient Active Problem List   Diagnosis Date Noted  . Family history of malignant neoplasm of gastrointestinal tract    . Abnormal ECG 05/24/2016  . Absolute anemia 05/24/2016  . Arthritis 05/24/2016  . Accumulation of fluid in tissues 05/24/2016  . 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 Vitiligo 05/24/2016  . Avitaminosis D 05/27/2009  . Hypercholesterolemia without hypertriglyceridemia 05/13/2009  . Breast lump 01/10/2006    Past Surgical History:  Procedure Laterality Date  . COLONOSCOPY  09/19/2012  . COLONOSCOPY WITH PROPOFOL N/A 10/12/2017   Procedure: COLONOSCOPY WITH PROPOFOL;  Surgeon: Lucilla Lame, MD;  Location: [place];  Service: Endoscopy;  Laterality: N/A;  . COLPOSCOPY  2000    Family History        Family Status  Relation Name Status  . Mother  Deceased  . Father  Deceased  . Brother  Deceased  . Rebekah [name]  (Not Specified)  . Neg Hx  (Not Specified)        Rebekah [name] family history includes Alcohol abuse in Rebekah [name] brother; Cancer in Rebekah [name] brother and father; Hypertension in Rebekah [name] father; Melanoma in Rebekah [name] father; Ovarian cancer in Rebekah [name] paternal aunt. There is no history of Breast cancer.      No Known Allergies   Current Outpatient Medications:  .  aspirin (ASPIRIN ADULT LOW STRENGTH) 81 MG EC tablet, Take by mouth., Disp: , Rfl:  .  Cholecalciferol (VITAMIN D PO), Take by mouth., Disp: , Rfl:  .  IBUPROFEN PO, Take by mouth as needed., Disp: , Rfl:  .  Misc Natural Products (GRAPE SEED COMPLEX) CAPS, Take by mouth., Disp: , Rfl:  .  Multiple Vitamins-Minerals (HAIR/SKIN/NAILS/BIOTIN PO), Take by mouth daily., Disp: , Rfl:  .  Omega-3 Fatty Acids (FISH OIL MAXIMUM STRENGTH) 1200 MG CAPS, Take by mouth., Disp: , Rfl:  .  TURMERIC PO, Take by mouth., Disp: , Rfl:    Patient Care Team: Rubye Beach as PCP - General (Family Medicine)    Objective:    Vitals: BP 111/77 (BP Location: Left Arm, Patient Position: Sitting, Cuff Size: Large)   Pulse 76   Temp 97.8 F (36.6 C) (Temporal)   Resp 16   Ht 5\' 8"  (1.727 m)   Wt 177 lb 12.8 oz (80.6 kg)   BMI 27.03 kg/m     Vitals:   10/02/19 1029  BP: 111/77  Pulse: 76  Resp: 16  Temp: 97.8 F (36.6 C)  TempSrc: Temporal  Weight: 177 lb 12.8 oz (80.6 kg)  Height: 5\' 8"  (1.727 m)     Physical Exam Vitals signs reviewed.  Constitutional:      General: She is not in acute distress.    Appearance: Normal appearance. She is well-developed and normal weight. She is  not ill-appearing or diaphoretic.  HENT:     Head: Normocephalic and atraumatic.     Right Ear: Tympanic membrane, ear canal and external ear normal.     Left Ear: Tympanic membrane, ear canal and external ear normal.     Nose: Nose normal.  Eyes:     General: No scleral icterus.       Right eye: No discharge.        Left eye: No discharge.     Extraocular Movements: Extraocular movements intact.     Conjunctiva/sclera: Conjunctivae normal.     Pupils: Pupils are equal, round, and reactive to light.  Neck:     Musculoskeletal: Normal range of motion and neck supple.     Thyroid: No thyromegaly.     Vascular: No carotid bruit or JVD.     Trachea: No tracheal deviation.  Cardiovascular:     Rate and Rhythm: Normal rate and regular rhythm.     Pulses: Normal pulses.     Heart sounds: Normal heart sounds. No murmur. No friction rub. No gallop.   Pulmonary:     Effort: Pulmonary effort is normal. No respiratory distress.     Breath sounds: Normal breath sounds. No wheezing or rales.  Chest:     Chest wall: No tenderness.     Breasts:        Right: Normal. No mass.        Left: Normal. No mass.  Abdominal:     General: Abdomen is flat. Bowel sounds are normal. There is no distension.     Palpations: Abdomen is soft. There is no mass.     Tenderness: There is no abdominal tenderness. There is no guarding or rebound.  Musculoskeletal: Normal range of motion.        General: No tenderness.     Right lower leg: No edema.     Left lower leg: No edema.  Lymphadenopathy:     Cervical: No cervical adenopathy.     Upper Body:     Right  upper body: No supraclavicular, axillary or pectoral adenopathy.     Left upper body: No supraclavicular, axillary or pectoral adenopathy.  Skin:    General: Skin is warm and dry.     Capillary Refill: Capillary refill takes less than 2 seconds.     Findings: No rash.  Neurological:     General: No focal deficit present.     Mental Status: She is alert and oriented to person, place, and time. Mental status is at baseline.  Psychiatric:        Mood and Affect: Mood normal.        Behavior: Behavior normal.        Thought Content: Thought content normal.        Judgment: Judgment normal.      Depression Screen PHQ 2/9 Scores 10/02/2019 05/28/2018 05/28/2017  PHQ - 2 Score 0 0 0  PHQ- 9 Score - - 0       Assessment & Plan:     Routine Health Maintenance and Physical Exam  Exercise Activities and Dietary recommendations Goals   None     Immunization History  Administered Date(s) Administered  . Influenza Split 09/05/2013, 08/06/2018  . Influenza, Quadrivalent, Recombinant, Inj, Pf 07/31/2019  . Influenza-Unspecified 07/31/2019  . Tdap 05/13/2009, 12/03/2018    Health Maintenance  Topic Date Due  . PAP SMEAR-Modifier  05/28/2020  . MAMMOGRAM  07/03/2020  . COLONOSCOPY  10/12/2022  . TETANUS/TDAP  12/03/2028  . INFLUENZA VACCINE  Completed  . Hepatitis C Screening  Completed  . HIV Screening  Completed     Discussed health benefits of physical activity, and encouraged Rebekah [name] to engage in regular exercise appropriate for Rebekah [name] age and condition.    1. Encounter for annual physical exam Normal physical exam today. Will check labs as below and f/u pending lab results. If labs are stable and WNL she will not need to have these rechecked for one year at Rebekah [name] next annual physical exam. She is to call the office in the meantime if she has any acute issue, questions or concerns. - CBC w/Diff - Comprehensive Metabolic Panel (CMET) - HgB A1c - TSH  2. Encounter for screening  mammogram for malignant neoplasm of breast Breast exam today was normal. There is no family history of breast cancer. She does perform regular self breast exams. Mammogram was ordered as below. Information for [facility] clinic was given to patient so she may schedule [facility] [name] mammogram at Rebekah [name] convenience. - MM 3D SCREEN BREAST BILATERAL; Future  3. Hypercholesterolemia without hypertriglyceridemia Diet controlled. Has lost 20 pounds since last year being told it was elevated. Will check labs as below and f/u pending results. - Lipid Profile  4. Elevated hemoglobin A1c See above medical treatment plan. Will check labs as below and f/u pending results. - HgB A1c  5. Screening for thyroid disorder Will check labs as below and f/u pending results. - TSH  6. Bilateral carpal tunnel syndrome Discussed conservative therapy with using night splints, NSAIDs prn. Will call if not improving and she desires referral back to [name].   7. Hammer toe of left foot Not problematic enough yet. Will call if worsens.   8. Trigger middle finger of right hand Not problematic enough yet. Will call if worsens.   --------------------------------------------------------------------    Mar Daring, PA-C  [facility]

## 2019-10-02 ENCOUNTER — Other Ambulatory Visit: Payer: Self-pay

## 2019-10-02 ENCOUNTER — Encounter: Payer: Self-pay | Admitting: Physician Assistant

## 2019-10-02 ENCOUNTER — Ambulatory Visit (INDEPENDENT_AMBULATORY_CARE_PROVIDER_SITE_OTHER): Payer: BC Managed Care – PPO | Admitting: Physician Assistant

## 2019-10-02 VITALS — BP 111/77 | HR 76 | Temp 97.8°F | Resp 16 | Ht 68.0 in | Wt 177.8 lb

## 2019-10-02 DIAGNOSIS — R7309 Other abnormal glucose: Secondary | ICD-10-CM

## 2019-10-02 DIAGNOSIS — Z0000 Encounter for general adult medical examination without abnormal findings: Secondary | ICD-10-CM | POA: Diagnosis not present

## 2019-10-02 DIAGNOSIS — M65331 Trigger finger, right middle finger: Secondary | ICD-10-CM

## 2019-10-02 DIAGNOSIS — M2042 Other hammer toe(s) (acquired), left foot: Secondary | ICD-10-CM

## 2019-10-02 DIAGNOSIS — E7800 Pure hypercholesterolemia, unspecified: Secondary | ICD-10-CM

## 2019-10-02 DIAGNOSIS — Z1329 Encounter for screening for other suspected endocrine disorder: Secondary | ICD-10-CM

## 2019-10-02 DIAGNOSIS — G5603 Carpal tunnel syndrome, bilateral upper limbs: Secondary | ICD-10-CM

## 2019-10-02 DIAGNOSIS — Z1231 Encounter for screening mammogram for malignant neoplasm of breast: Secondary | ICD-10-CM

## 2019-10-02 NOTE — Patient Instructions (Signed)
Voltaren Gel, Salonpas lidocaine patches  Carpal Tunnel Syndrome  Carpal tunnel syndrome is a condition that causes pain in your hand and arm. The carpal tunnel is a narrow area that is on the palm side of your wrist. Repeated wrist motion or certain diseases may cause swelling in the tunnel. This swelling can pinch the main nerve in the wrist (median nerve). What are the causes? This condition may be caused by:  Repeated wrist motions.  Wrist injuries.  Arthritis.  A sac of fluid (cyst) or abnormal growth (tumor) in the carpal tunnel.  Fluid buildup during pregnancy. Sometimes the cause is not known. What increases the risk? The following factors may make you more likely to develop this condition:  Having a job in which you move your wrist in the same way many times. This includes jobs like being a Software engineer or a Scientist, water quality.  Being a woman.  Having other health conditions, such as: ? Diabetes. ? Obesity. ? A thyroid gland that is not active enough (hypothyroidism). ? Kidney failure. What are the signs or symptoms? Symptoms of this condition include:  A tingling feeling in your fingers.  Tingling or a loss of feeling (numbness) in your hand.  Pain in your entire arm. This pain may get worse when you bend your wrist and elbow for a long time.  Pain in your wrist that goes up your arm to your shoulder.  Pain that goes down into your palm or fingers.  A weak feeling in your hands. You may find it hard to grab and hold items. You may feel worse at night. How is this diagnosed? This condition is diagnosed with a medical history and physical exam. You may also have tests, such as:  Electromyogram (EMG). This test checks the signals that the nerves send to the muscles.  Nerve conduction study. This test checks how well signals pass through your nerves.  Imaging tests, such as X-rays, ultrasound, and MRI. These tests check for what might be the cause of your condition. How is  this treated? This condition may be treated with:  Lifestyle changes. You will be asked to stop or change the activity that caused your problem.  Doing exercise and activities that make bones and muscles stronger (physical therapy).  Learning how to use your hand again (occupational therapy).  Medicines for pain and swelling (inflammation). You may have injections in your wrist.  A wrist splint.  Surgery. Follow these instructions at home: If you have a splint:  Wear the splint as told by your doctor. Remove it only as told by your doctor.  Loosen the splint if your fingers: ? Tingle. ? Lose feeling (become numb). ? Turn cold and blue.  Keep the splint clean.  If the splint is not waterproof: ? Do not let it get wet. ? Cover it with a watertight covering when you take a bath or a shower. Managing pain, stiffness, and swelling   If told, put ice on the painful area: ? If you have a removable splint, remove it as told by your doctor. ? Put ice in a plastic bag. ? Place a towel between your skin and the bag. ? Leave the ice on for 20 minutes, 2-3 times per day. General instructions  Take over-the-counter and prescription medicines only as told by your doctor.  Rest your wrist from any activity that may cause pain. If needed, talk with your boss at work about changes that can help your wrist heal.  Do any  exercises as told by your doctor, physical therapist, or occupational therapist.  Keep all follow-up visits as told by your doctor. This is important. Contact a doctor if:  You have new symptoms.  Medicine does not help your pain.  Your symptoms get worse. Get help right away if:  You have very bad numbness or tingling in your wrist or hand. Summary  Carpal tunnel syndrome is a condition that causes pain in your hand and arm.  It is often caused by repeated wrist motions.  Lifestyle changes and medicines are used to treat this problem. Surgery may help in  very bad cases.  Follow your doctor's instructions about wearing a splint, resting your wrist, keeping follow-up visits, and calling for help. This information is not intended to replace advice given to you by your health care provider. Make sure you discuss any questions you have with your health care provider. Document Released: 10/05/2011 Document Revised: 02/22/2018 Document Reviewed: 02/22/2018 Elsevier Patient Education  [place].   Wrist and Forearm Exercises Ask your health care provider which exercises are safe for you. Do exercises exactly as told by your health care provider and adjust them as directed. It is normal to feel mild stretching, pulling, tightness, or discomfort as you do these exercises. Stop right away if you feel sudden pain or your pain gets worse. Do not begin these exercises until told by your health care provider. Range-of-motion exercises These exercises warm up your muscles and joints and improve the movement and flexibility of your injured wrist and forearm. These exercises also help to relieve pain, numbness, and tingling. These exercises are done using the muscles in your injured wrist and forearm. Wrist flexion 1. Bend your left / right elbow to a 90-degree angle (right angle) with your palm facing the floor. 2. Bend your wrist so that your fingers point toward the floor (flexion). 3. Hold this position for __________ seconds. 4. Slowly return to the starting position. Repeat __________ times. Complete this exercise __________ times a day. Wrist extension 1. Bend your left / right elbow to a 90-degree angle (right angle) with your palm facing the floor. 2. Bend your wrist so that your fingers point toward the ceiling (extension). 3. Hold this position for __________ seconds. 4. Slowly return to the starting position. Repeat __________ times. Complete this exercise __________ times a day. Ulnar deviation 1. Bend your left / right elbow to a  90-degree angle (right angle), and rest your forearm on a table with your palm facing down. 2. Keeping your hand flat on the table, bend your left /right wrist toward your small finger (pinkie). This is ulnar deviation. 3. Hold this position for __________ seconds. 4. Slowly return to the starting position. Repeat __________ times. Complete this exercise __________ times a day. Radial deviation 1. Bend your left / right elbow to a 90-degree angle (right angle), and rest your forearm on a table with your palm facing down. 2. Keeping your hand flat on the table, bend your left /right wrist toward your thumb. This is radial deviation. 3. Hold this position for __________ seconds. 4. Slowly return to the starting position. Repeat __________ times. Complete this exercise __________ times a day. Forearm rotation, supination  1. Sit with your left / right elbow bent to a 90-degree angle (right angle). Position your forearm so that the thumb is facing the ceiling (neutral position). 2. Turn (rotate) your palm up toward the ceiling (supination), stopping when you feel a gentle stretch. 3.  Hold this position for __________ seconds. 4. Slowly return to the starting position. Repeat __________ times. Complete this exercise __________ times a day. Forearm rotation, pronation  1. Sit with your left / right elbow bent to a 90-degree angle (right angle). Position your forearm so that the thumb is facing the ceiling (neutral position). 2. Rotate your palm down toward the floor (pronation), stopping when you feel a gentle stretch. 3. Hold this position for __________ seconds. 4. Slowly return to the starting position. Repeat __________ times. Complete this exercise __________ times a day. Stretching These exercises warm up your muscles and joints and improve the movement and flexibility of your injured wrist and forearm. These exercises also help to relieve pain, numbness, and tingling. These exercises are  done using your healthy wrist and forearm to help stretch the muscles in your injured wrist and forearm. Wrist flexion  1. Extend your left / right arm in front of you, and turn your palm down toward the floor. ? If told by your health care provider, bend your left / right elbow to a 90-degree angle (right angle) at your side. 2. Using your uninjured hand, gently press over the back of your left / right hand to bend your wrist and fingers toward the floor (flexion). Go as far as you can to feel a stretch without causing pain. 3. Hold this position for __________ seconds. 4. Slowly return to the starting position. Repeat __________ times. Complete this exercise __________ times a day. Wrist extension  1. Extend your left / right arm in front of you and turn your palm up toward the ceiling. ? If told by your health care provider, bend your left / right elbow to a 90-degree angle (right angle) at your side. 2. Using your uninjured hand, gently press over the palm of your left / right hand to bend your wrist and fingers toward the floor (extension). Go as far as you can to feel a stretch without causing pain. 3. Hold this position for __________ seconds. 4. Slowly return to the starting position. Repeat __________ times. Complete this exercise __________ times a day. Forearm rotation, supination 1. Sit with your left / right elbow bent to a 90-degree angle (right angle). Position your forearm so that the thumb is facing the ceiling (neutral position). 2. Rotate your palm up toward the ceiling as far as you can on your own (supination). Then, use your uninjured hand to help turn your forearm more, stopping when you feel a gentle stretch. 3. Hold this position for __________ seconds. 4. Slowly return to the starting position. Repeat __________ times. Complete this exercise __________ times a day. Forearm rotation, pronation 1. Sit with your left / right elbow bent to a 90-degree angle (right  angle). Position your forearm so that the thumb is facing the ceiling (neutral position). 2. Rotate your palm down toward the floor as far as you can on your own (pronation). Then, use your uninjured hand to help turn your forearm more, stopping when you feel a gentle stretch. 3. Hold this position for __________ seconds. 4. Slowly return to the starting position. Repeat __________ times. Complete this exercise __________ times a day. Strengthening exercises These exercises build strength and endurance in your wrist and forearm. Endurance is the ability to use your muscles for a long time, even after they get tired. Wrist flexion  1. Sit with your left / right forearm supported on a table or other surface. Bend your elbow to a 90-degree angle (  right angle), and rest your hand palm-up over the edge of the table. 2. Hold a __________ weight in your left / right hand. Or, hold an exercise band or tube in both hands, keeping your hands at the same level and hip distance apart. There should be a slight tension in the exercise band or tube. 3. Slowly curl your hand up toward the ceiling (flexion). 4. Hold this position for __________ seconds. 5. Slowly lower your hand back to the starting position. Repeat __________ times. Complete this exercise __________ times a day. Wrist extension  1. Sit with your left / right forearm supported on a table or other surface. Bend your elbow to a 90-degree angle (right angle), and rest your hand palm-down over the edge of the table. 2. Hold a __________ weight in your left / right hand. Or, hold an exercise band or tube in both hands, keeping your hands at the same level and hip distance apart. There should be a slight tension in the exercise band or tube. 3. Slowly curl your hand up toward the ceiling (extension). 4. Hold this position for __________ seconds. 5. Slowly lower your hand back to the starting position. Repeat __________ times. Complete this exercise  __________ times a day. Forearm rotation, supination  1. Sit with your left / right forearm supported on a table or other surface. Bend your elbow to a 90-degree angle (right angle). Position your forearm so that your thumb is facing the ceiling (neutral position) and your hand is resting over the edge of the table. 2. Hold a hammer in your left / right hand. ? This exercise will be easier if you hold the hammer near the head of the hammer. ? This exercise will be harder if you hold the hammer near the end of the handle. 3. Without moving your elbow, slowly rotate your palm up toward the ceiling (supination). 4. Hold this position for __________ seconds. 5. Slowly return to the starting position. Repeat __________ times. Complete this exercise __________ times a day. Forearm rotation, pronation  1. Sit with your left / right forearm supported on a table or other surface. Bend your elbow to a 90-degree angle (right angle). Position your forearm so that the thumb is facing the ceiling (neutral position), with your hand resting over the edge of the table. 2. Hold a hammer in your left / right hand. ? This exercise will be easier if you hold the hammer near the head of the hammer. ? This exercise will be harder if you hold the hammer near the end of the handle. 3. Without moving your elbow, slowly rotate your palm down toward the floor (pronation). 4. Hold this position for __________ seconds. 5. Slowly return to the starting position. Repeat __________ times. Complete this exercise __________ times a day. Grip strengthening  1. Grasp a stress ball or other ball in the middle of your left / right hand. Start with your elbow bent to a 90-degree angle (right angle). 2. Slowly increase the pressure, squeezing the ball as hard as you can without causing pain. ? Think of bringing the tips of your fingers into the middle of your palm. All of your finger joints should bend when doing this exercise. ?  To make this exercise harder, gradually try to straighten your elbow in front of you, until you can do the exercise with your elbow fully straight. 3. Hold your squeeze for __________ seconds, then relax. If instructed by your health care provider, do this  exercise: ? With your forearm positioned so that the thumb is facing the ceiling (neutral position). ? With your forearm turned palm down. ? With your forearm turned palm up. Repeat __________ times. Complete this exercise __________ times a day. This information is not intended to replace advice given to you by your health care provider. Make sure you discuss any questions you have with your health care provider. Document Released: 08/30/2005 Document Revised: 12/05/2018 Document Reviewed: 12/05/2018 Elsevier Patient Education  [place].

## 2019-10-10 ENCOUNTER — Telehealth: Payer: Self-pay

## 2019-10-10 LAB — CBC WITH DIFFERENTIAL/PLATELET
Basophils Absolute: 0 10*3/uL (ref 0.0–0.2)
Basos: 1 %
EOS (ABSOLUTE): 0.1 10*3/uL (ref 0.0–0.4)
Eos: 2 %
Hematocrit: 35.8 % (ref 34.0–46.6)
Hemoglobin: 11.9 g/dL (ref 11.1–15.9)
Immature Grans (Abs): 0 10*3/uL (ref 0.0–0.1)
Immature Granulocytes: 0 %
Lymphocytes Absolute: 1.4 10*3/uL (ref 0.7–3.1)
Lymphs: 29 %
MCH: 29.2 pg (ref 26.6–33.0)
MCHC: 33.2 g/dL (ref 31.5–35.7)
MCV: 88 fL (ref 79–97)
Monocytes Absolute: 0.5 10*3/uL (ref 0.1–0.9)
Monocytes: 10 %
Neutrophils Absolute: 2.8 10*3/uL (ref 1.4–7.0)
Neutrophils: 58 %
Platelets: 255 10*3/uL (ref 150–450)
RBC: 4.07 x10E6/uL (ref 3.77–5.28)
RDW: 12.5 % (ref 11.7–15.4)
WBC: 4.7 10*3/uL (ref 3.4–10.8)

## 2019-10-10 LAB — COMPREHENSIVE METABOLIC PANEL
ALT: 15 IU/L (ref 0–32)
AST: 21 IU/L (ref 0–40)
Albumin/Globulin Ratio: 1.7 (ref 1.2–2.2)
Albumin: 4.3 g/dL (ref 3.8–4.9)
Alkaline Phosphatase: 76 IU/L (ref 39–117)
BUN/Creatinine Ratio: 26 (ref 12–28)
BUN: 16 mg/dL (ref 8–27)
Bilirubin Total: 0.5 mg/dL (ref 0.0–1.2)
CO2: 22 mmol/L (ref 20–29)
Calcium: 9.2 mg/dL (ref 8.7–10.3)
Chloride: 104 mmol/L (ref 96–106)
Creatinine, Ser: 0.61 mg/dL (ref 0.57–1.00)
GFR calc Af Amer: 114 mL/min/{1.73_m2} (ref >59)
GFR calc non Af Amer: 99 mL/min/{1.73_m2} (ref >59)
Globulin, Total: 2.5 g/dL (ref 1.5–4.5)
Glucose: 88 mg/dL (ref 65–99)
Potassium: 4.2 mmol/L (ref 3.5–5.2)
Sodium: 140 mmol/L (ref 134–144)
Total Protein: 6.8 g/dL (ref 6.0–8.5)

## 2019-10-10 LAB — HEMOGLOBIN A1C
Est. average glucose Bld gHb Est-mCnc: 105 mg/dL
Hgb A1c MFr Bld: 5.3 % (ref 4.8–5.6)

## 2019-10-10 LAB — LIPID PANEL
Chol/HDL Ratio: 2.7 ratio (ref 0.0–4.4)
Cholesterol, Total: 211 mg/dL — ABNORMAL HIGH (ref 100–199)
HDL: 78 mg/dL (ref >39)
LDL Chol Calc (NIH): 121 mg/dL — ABNORMAL HIGH (ref 0–99)
Triglycerides: 67 mg/dL (ref 0–149)
VLDL Cholesterol Cal: 12 mg/dL (ref 5–40)

## 2019-10-10 LAB — TSH: TSH: 2.3 u[IU]/mL (ref 0.450–4.500)

## 2019-10-10 NOTE — Telephone Encounter (Signed)
LMTCB PEC may give results 

## 2019-10-10 NOTE — Telephone Encounter (Signed)
-----   Message from Mar Daring, [place] sent at 10/10/2019  9:59 AM EST ----- Blood count is normal. Kidney and liver function are normal. Sodium, potassium and calcium are normal. A1c/sugar are now normal. Cholesterol is still just borderline high but much improved compared to last year. Keep up the good work. Thyroid is normal.

## 2019-10-10 NOTE — Telephone Encounter (Signed)
Pt. Called back. Given lab results, verbalizes understanding.

## 2019-10-15 ENCOUNTER — Encounter: Payer: Self-pay | Admitting: Physician Assistant

## 2019-10-15 DIAGNOSIS — G5603 Carpal tunnel syndrome, bilateral upper limbs: Secondary | ICD-10-CM

## 2019-10-15 DIAGNOSIS — M65331 Trigger finger, right middle finger: Secondary | ICD-10-CM

## 2019-12-05 ENCOUNTER — Telehealth: Payer: Self-pay | Admitting: Physician Assistant

## 2019-12-05 NOTE — Telephone Encounter (Signed)
Yes I think I can do that. Unfortunately I do not know 100% sure until I see the form, but I feel I have done this for her or other patients in the past.

## 2019-12-05 NOTE — Telephone Encounter (Signed)
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 Patient had CPE on 10/01/20. Please advise Cb- [phone]

## 2019-12-05 NOTE — Telephone Encounter (Signed)
Patient advised as directed below. 

## 2020-01-24 ENCOUNTER — Ambulatory Visit: Payer: BC Managed Care – PPO

## 2020-04-28 ENCOUNTER — Other Ambulatory Visit: Payer: Self-pay

## 2020-04-28 ENCOUNTER — Ambulatory Visit (INDEPENDENT_AMBULATORY_CARE_PROVIDER_SITE_OTHER): Payer: BC Managed Care – PPO | Admitting: Dermatology

## 2020-04-28 DIAGNOSIS — L720 Epidermal cyst: Secondary | ICD-10-CM | POA: Diagnosis not present

## 2020-04-28 MED ORDER — DOXYCYCLINE HYCLATE 100 MG PO CAPS: 100.0000 mg | ORAL_CAPSULE | Freq: Two times a day (BID) | ORAL | 0 refills | Status: AC

## 2020-04-28 NOTE — Progress Notes (Signed)
   New Patient Visit  Subjective  Rebekah [name] is a 59 y.o. female who presents for the following: Other (upper back x ~1 year. Was sore last week.).  The following portions of the chart were reviewed this encounter and updated as appropriate:  Tobacco  Allergies  Meds  Problems  Med Hx  Surg Hx  Fam Hx     Review of Systems:  No other skin or systemic complaints except as noted in HPI or Assessment and Plan.  Objective  Well appearing patient in no apparent distress; mood and affect are within normal limits.  A focused examination was performed including back. Relevant physical exam findings are noted in the Assessment and Plan.  Objective  Mid Back, Right Upper Back: 2.5cm Subcutaneous papule/nodule with erythema and edema, tender to touch. Right upper back  1.0cm Subcutaneous nodule. Mid back   Assessment & Plan    Epidermal inclusion cyst x 2 - One inflamed of R upper back Right Upper Back; Mid Back  Discussed risk of abscess.  Recommend excision. Plan to excise lesion at right upper back first.  doxycycline (VIBRAMYCIN) 100 MG capsule - Right Upper Back  Return for surgery for cyst of right upper back.   I, Ashok Cordia, CMA, am acting as scribe for Sarina Ser, MD .  Documentation: I have reviewed the above documentation for accuracy and completeness, and I agree with the above.  Sarina Ser, MD

## 2020-04-28 NOTE — Patient Instructions (Addendum)
Pre-op information given.  Recommend patient stop taking blood thinner medication (i.e. aspirin) 14 days prior to procedure, or as recommended by PCP/cardiologist.  Doxycycline should be taken with food to prevent nausea. Do not lay down for 30 minutes after taking. Be cautious with sun exposure and use good sun protection while on this medication. Pregnant women should not take this medication.

## 2020-05-10 ENCOUNTER — Encounter: Payer: Self-pay | Admitting: Dermatology

## 2020-06-22 ENCOUNTER — Other Ambulatory Visit: Payer: Self-pay

## 2020-06-22 ENCOUNTER — Ambulatory Visit (INDEPENDENT_AMBULATORY_CARE_PROVIDER_SITE_OTHER): Payer: BC Managed Care – PPO | Admitting: Dermatology

## 2020-06-22 DIAGNOSIS — L80 Vitiligo: Secondary | ICD-10-CM | POA: Diagnosis not present

## 2020-06-22 DIAGNOSIS — D485 Neoplasm of uncertain behavior of skin: Secondary | ICD-10-CM | POA: Diagnosis not present

## 2020-06-22 DIAGNOSIS — D492 Neoplasm of unspecified behavior of bone, soft tissue, and skin: Secondary | ICD-10-CM

## 2020-06-22 MED ORDER — MUPIROCIN 2 % EX OINT: 1.0000 "application " | TOPICAL_OINTMENT | Freq: Every day | CUTANEOUS | 1 refills | Status: DC

## 2020-06-22 NOTE — Progress Notes (Signed)
   Follow-Up Visit   Subjective  Rebekah [name] is a 59 y.o. female who presents for the following: Cyst (mid back, pt presents for excision) and vitiligo (feet, wrist).  The following portions of the chart were reviewed this encounter and updated as appropriate:  Tobacco  Allergies  Meds  Problems  Med Hx  Surg Hx  Fam Hx     Review of Systems:  No other skin or systemic complaints except as noted in HPI or Assessment and Plan.  Objective  Well appearing patient in no apparent distress; mood and affect are within normal limits.  A focused examination was performed including back. Relevant physical exam findings are noted in the Assessment and Plan.  Objective  Mid back: Cystic pap 2.1 x 1.1cm  Objective  wrist, feet: Hypopigmented patches feet, wrist   Assessment & Plan  Neoplasm of skin Mid back  mupirocin ointment (BACTROBAN) 2 %  Skin excision  Lesion length (cm):  2.1 Lesion width (cm):  1.1 Margin per side (cm):  0 Total excision diameter (cm):  2.1 Informed consent: discussed and consent obtained   Timeout: patient name, date of birth, surgical site, and procedure verified   Procedure prep:  Patient was prepped and draped in usual sterile fashion Prep type:  Isopropyl alcohol and povidone-iodine Anesthesia: the lesion was anesthetized in a standard fashion   Anesthesia comment:  10.0cc Anesthetic:  1% lidocaine w/ epinephrine 1-100,000 buffered w/ 8.4% NaHCO3 Instrument used: #15 blade   Hemostasis achieved with: pressure   Hemostasis achieved with comment:  Electrocautery Outcome: patient tolerated procedure well with no complications   Post-procedure details: sterile dressing applied and wound care instructions given   Dressing type: bandage and pressure dressing (Mupirocin)    Skin repair Complexity:  Complex Final length (cm):  3 Reason for type of repair: reduce tension to allow closure, reduce the risk of dehiscence, infection, and  necrosis, reduce subcutaneous dead space and avoid a hematoma, allow closure of the large defect, preserve normal anatomy, preserve normal anatomical and functional relationships and enhance both functionality and cosmetic results   Undermining: area extensively undermined   Undermining comment:  Undermining Defect 1.5cm Subcutaneous layers (deep stitches):  Suture size:  2-0 Suture type: Vicryl (polyglactin 910)   Subcutaneous suture technique: Inverted Dermal. Fine/surface layer approximation (top stitches):  Suture size:  3-0 Suture type: nylon   Stitches: horizontal mattress   Stitches comment:  Nylon Suture removal (days):  7 Hemostasis achieved with: suture and pressure Outcome: patient tolerated procedure well with no complications   Post-procedure details: sterile dressing applied and wound care instructions given   Dressing type: bandage and pressure dressing (Mupirocin)    Specimen 1 - Surgical pathology Differential Diagnosis: D48.5 Cyst vs other Check Margins: yes Cystic pap 2.1 x 1.1cm  Cyst vs other Start Mupirocin oint qd to excision site  Vitiligo wrist, feet Discussed condition and treatment options. She may consider topical anti-inflammatory such as Protopic or topical steroids.  Also discussed light treatment such as narrowband ultraviolet B light or XTRAC laser.  Recommend vitamins a C and E. Discussed Morrie Sheldon, not FDA approved at this time for Vitiligo Patient declines treatment at this time.  Return in about 1 week (around 06/29/2020) for suture removal.   I, Othelia Pulling, RMA, am acting as scribe for Sarina Ser, MD .  Documentation: I have reviewed the above documentation for accuracy and completeness, and I agree with the above.  Sarina Ser, MD

## 2020-06-22 NOTE — Patient Instructions (Signed)

## 2020-06-23 ENCOUNTER — Telehealth: Payer: Self-pay

## 2020-06-23 NOTE — Telephone Encounter (Signed)
Pt doing well after yesterday's surgery./sh 

## 2020-06-29 ENCOUNTER — Encounter: Payer: Self-pay | Admitting: Dermatology

## 2020-06-29 ENCOUNTER — Other Ambulatory Visit: Payer: Self-pay

## 2020-06-29 ENCOUNTER — Ambulatory Visit (INDEPENDENT_AMBULATORY_CARE_PROVIDER_SITE_OTHER): Payer: BC Managed Care – PPO | Admitting: Dermatology

## 2020-06-29 DIAGNOSIS — Z4802 Encounter for removal of sutures: Secondary | ICD-10-CM

## 2020-06-29 DIAGNOSIS — L720 Epidermal cyst: Secondary | ICD-10-CM

## 2020-06-29 NOTE — Patient Instructions (Signed)

## 2020-06-29 NOTE — Progress Notes (Signed)
   Follow-Up Visit   Subjective  Rebekah [name] is a 59 y.o. female who presents for the following: Suture / Staple Removal (mid back, benign cyst, margins free).  The following portions of the chart were reviewed this encounter and updated as appropriate: Tobacco  Allergies  Meds  Problems  Med Hx  Surg Hx  Fam Hx     Review of Systems: No other skin or systemic complaints except as noted in HPI or Assessment and Plan.   Objective  Well appearing patient in no apparent distress; mood and affect are within normal limits.  A focused examination was performed including back. Relevant physical exam findings are noted in the Assessment and Plan.  Objective  Mid Back: Benign cyst  Assessment & Plan  Epidermoid cyst - S/P excision Mid Back  Encounter for Removal of Sutures - Incision site at the mid back is clean, dry and intact - Wound cleansed, sutures removed, wound cleansed and steri strips applied.  - Discussed pathology results showing benign cyst, margins free  - Patient advised to keep steri-strips dry until they fall off. - Scars remodel for a full year. - Once steri-strips fall off, patient can apply over-the-counter silicone scar cream each night to help with scar remodeling if desired. - Patient advised to call with any concerns or if they notice any new or changing lesions.   Return if symptoms worsen or fail to improve.   IHarriett Sine, CMA, am acting as scribe for Sarina Ser, MD.  Documentation: I have reviewed the above documentation for accuracy and completeness, and I agree with the above.  Sarina Ser, MD

## 2020-07-03 ENCOUNTER — Encounter: Payer: Self-pay | Admitting: Dermatology

## 2020-11-22 ENCOUNTER — Other Ambulatory Visit: Payer: Self-pay

## 2020-11-22 ENCOUNTER — Ambulatory Visit (INDEPENDENT_AMBULATORY_CARE_PROVIDER_SITE_OTHER): Payer: BC Managed Care – PPO | Admitting: Physician Assistant

## 2020-11-22 ENCOUNTER — Encounter: Payer: Self-pay | Admitting: Physician Assistant

## 2020-11-22 VITALS — BP 117/86 | HR 93 | Temp 98.4°F | Ht 68.0 in | Wt 160.0 lb

## 2020-11-22 DIAGNOSIS — Z0000 Encounter for general adult medical examination without abnormal findings: Secondary | ICD-10-CM | POA: Diagnosis not present

## 2020-11-22 DIAGNOSIS — R002 Palpitations: Secondary | ICD-10-CM

## 2020-11-22 DIAGNOSIS — R251 Tremor, unspecified: Secondary | ICD-10-CM | POA: Diagnosis not present

## 2020-11-22 DIAGNOSIS — Z1239 Encounter for other screening for malignant neoplasm of breast: Secondary | ICD-10-CM | POA: Diagnosis not present

## 2020-11-22 DIAGNOSIS — E559 Vitamin D deficiency, unspecified: Secondary | ICD-10-CM

## 2020-11-22 DIAGNOSIS — E7800 Pure hypercholesterolemia, unspecified: Secondary | ICD-10-CM

## 2020-11-22 DIAGNOSIS — Z87898 Personal history of other specified conditions: Secondary | ICD-10-CM

## 2020-11-22 DIAGNOSIS — R634 Abnormal weight loss: Secondary | ICD-10-CM | POA: Diagnosis not present

## 2020-11-22 DIAGNOSIS — E0590 Thyrotoxicosis, unspecified without thyrotoxic crisis or storm: Secondary | ICD-10-CM | POA: Diagnosis not present

## 2020-11-22 NOTE — Patient Instructions (Signed)
Preventive Care 62-62 Years Old, Female Preventive care refers to lifestyle choices and visits with your health care provider that can promote health and wellness. This includes:  A yearly physical exam. This is also called an annual wellness visit.  Regular dental and eye exams.  Immunizations.  Screening for certain conditions.  Healthy lifestyle choices, such as: ? Eating a healthy diet. ? Getting regular exercise. ? Not using drugs or products that contain nicotine and tobacco. ? Limiting alcohol use. What can I expect for my preventive care visit? Physical exam Your health care provider will check your:  Height and weight. These may be used to calculate your BMI (body mass index). BMI is a measurement that tells if you are at a healthy weight.  Heart rate and blood pressure.  Body temperature.  Skin for abnormal spots. Counseling Your health care provider may ask you questions about your:  Past medical problems.  Family's medical history.  Alcohol, tobacco, and drug use.  Emotional well-being.  Home life and relationship well-being.  Sexual activity.  Diet, exercise, and sleep habits.  Work and work Statistician.  Access to firearms.  Method of birth control.  Menstrual cycle.  Pregnancy history. What immunizations do I need? Vaccines are usually given at various ages, according to a schedule. Your health care provider will recommend vaccines for you based on your age, medical history, and lifestyle or other factors, such as travel or where you work.   What tests do I need? Blood tests  Lipid and cholesterol levels. These may be checked every 5 years, or more often if you are over 3 years old.  Hepatitis C test.  Hepatitis B test. Screening  Lung cancer screening. You may have this screening every year starting at age 73 if you have a 30-pack-year history of smoking and currently smoke or have quit within the past 15 years.  Colorectal cancer  screening. ? All adults should have this screening starting at age 52 and continuing until age 17. ? Your health care provider may recommend screening at age 49 if you are at increased risk. ? You will have tests every 1-10 years, depending on your results and the type of screening test.  Diabetes screening. ? This is done by checking your blood sugar (glucose) after you have not eaten for a while (fasting). ? You may have this done every 1-3 years.  Mammogram. ? This may be done every 1-2 years. ? Talk with your health care provider about when you should start having regular mammograms. This may depend on whether you have a family history of breast cancer.  BRCA-related cancer screening. This may be done if you have a family history of breast, ovarian, tubal, or peritoneal cancers.  Pelvic exam and Pap test. ? This may be done every 3 years starting at age 10. ? Starting at age 11, this may be done every 5 years if you have a Pap test in combination with an HPV test. Other tests  STD (sexually transmitted disease) testing, if you are at risk.  Bone density scan. This is done to screen for osteoporosis. You may have this scan if you are at high risk for osteoporosis. Talk with your health care provider about your test results, treatment options, and if necessary, the need for more tests. Follow these instructions at home: Eating and drinking  Eat a diet that includes fresh fruits and vegetables, whole grains, lean protein, and low-fat dairy products.  Take vitamin and mineral supplements  as recommended by your health care provider.  Do not drink alcohol if: ? Your health care provider tells you not to drink. ? You are pregnant, may be pregnant, or are planning to become pregnant.  If you drink alcohol: ? Limit how much you have to 0-1 drink a day. ? Be aware of how much alcohol is in your drink. In the [place] one drink equals one 12 oz bottle of beer (355 mL), one 5 oz glass of  wine (148 mL), or one 1 oz glass of hard liquor (44 mL).   Lifestyle  Take daily care of your teeth and gums. Brush your teeth every morning and night with fluoride toothpaste. Floss one time each day.  Stay active. Exercise for at least 30 minutes 5 or more days each week.  Do not use any products that contain nicotine or tobacco, such as cigarettes, e-cigarettes, and chewing tobacco. If you need help quitting, ask your health care provider.  Do not use drugs.  If you are sexually active, practice safe sex. Use a condom or other form of protection to prevent STIs (sexually transmitted infections).  If you do not wish to become pregnant, use a form of birth control. If you plan to become pregnant, see your health care provider for a prepregnancy visit.  If told by your health care provider, take low-dose aspirin daily starting at age 50.  Find healthy ways to cope with stress, such as: ? Meditation, yoga, or listening to music. ? Journaling. ? Talking to a trusted person. ? Spending time with friends and family. Safety  Always wear your seat belt while driving or riding in a vehicle.  Do not drive: ? If you have been drinking alcohol. Do not ride with someone who has been drinking. ? When you are tired or distracted. ? While texting.  Wear a helmet and other protective equipment during sports activities.  If you have firearms in your house, make sure you follow all gun safety procedures. What's next?  Visit your health care provider once a year for an annual wellness visit.  Ask your health care provider how often you should have your eyes and teeth checked.  Stay up to date on all vaccines. This information is not intended to replace advice given to you by your health care provider. Make sure you discuss any questions you have with your health care provider. Document Revised: 07/20/2020 Document Reviewed: 06/27/2018 Elsevier Patient Education  2021 Elsevier Inc.  

## 2020-11-22 NOTE — Progress Notes (Signed)
Complete physical exam   Patient: Rebekah [name]   DOB: 1961-06-28   60 y.o. Female  MRN: [id number] Visit Date: 11/22/2020  Today's healthcare provider: Mar Daring, PA-C   Chief Complaint  Patient presents with  . Annual Exam   Subjective    Rebekah [name] is a 60 y.o. female who presents today for a complete physical exam.  She reports consuming a general diet. Exercises Regularly. She generally feels fairly well. She reports sleeping fairly well. She does have additional problems to discuss today.  HPI  Unexplained weight loss. Had stomach virus in December 15th. Then got covid december 27th. Weight may have stabilized.  Hand tremors. Feels may be secondary to stress recently. Has not been as prominent as it was.  Heart palpitations. Intermittent. No associated SOB, chest pain, dizziness, or syncope.  Trigger finger. Has seen Ortho before for trigger finger in the right thumb and right middle finger. Now still having in right middle finger and in left thumb. Also having symptoms of carpal tunnel bilaterally.   Past Medical History:  Diagnosis Date  . Anemia   . Arthritis   . Hyperlipidemia   . Motion sickness    back seat of cars  . Varicose veins   . Vitamin D deficiency   . Vitiligo   . Wears contact lenses    Past Surgical History:  Procedure Laterality Date  . COLONOSCOPY  09/19/2012  . COLONOSCOPY WITH PROPOFOL N/A 10/12/2017   Procedure: COLONOSCOPY WITH PROPOFOL;  Surgeon: Lucilla Lame, MD;  Location: [facility];  Service: Endoscopy;  Laterality: N/A;  . COLPOSCOPY  2000   Social History   Socioeconomic History  . Marital status: Married    Spouse name: Hubbard Robinson  . Number of children: 3  . Years of education: 16  . Highest education level: Bachelor's degree (e.g., BA, AB, BS)  Occupational History  . Occupation: Associate Professor: [employer]    Comment: [employer]  Tobacco Use  . Smoking status: Never Smoker   . Smokeless tobacco: Never Used  Vaping Use  . Vaping Use: Never used  Substance and Sexual Activity  . Alcohol use: Yes    Alcohol/week: 1.0 standard drink    Types: 1 Glasses of wine per week    Comment: 1 drink 2-4 times per month  . Drug use: No  . Sexual activity: Not on file  Other Topics Concern  . Not on file  Social History Narrative  . Not on file   Social Determinants of Health   Financial Resource Strain: Not on file  Food Insecurity: Not on file  Transportation Needs: Not on file  Physical Activity: Not on file  Stress: Not on file  Social Connections: Not on file  Intimate Partner Violence: Not on file   Family Status  Relation Name Status  . Mother  Deceased  . Father  Deceased  . Brother  Deceased  . Ethlyn Daniels  (Not Specified)  . Neg Hx  (Not Specified)   Family History  Problem Relation Age of Onset  . Cancer Father        lung cancer  . Melanoma Father   . Hypertension Father   . Cancer Brother        colon cancer  . Alcohol abuse Brother   . Ovarian cancer Paternal Aunt   . Breast cancer Neg Hx    No Known Allergies  Patient Care Team: [place],  Clearnce Sorrel, PA-C as PCP - General (Family Medicine)   Medications: Outpatient Medications Prior to Visit  Medication Sig  . aspirin (ASPIRIN ADULT LOW STRENGTH) 81 MG EC tablet Take by mouth.  . Cholecalciferol (VITAMIN D PO) Take by mouth.  . IBUPROFEN PO Take by mouth as needed.  . Misc Natural Products (GRAPE SEED COMPLEX) CAPS Take by mouth.  . Multiple Vitamins-Minerals (HAIR/SKIN/NAILS/BIOTIN PO) Take by mouth daily.  . mupirocin ointment (BACTROBAN) 2 % Apply 1 application topically daily. Qd to excision site  . Omega-3 Fatty Acids (FISH OIL MAXIMUM STRENGTH) 1200 MG CAPS Take by mouth.  . TURMERIC PO Take by mouth.   No facility-administered medications prior to visit.    Review of Systems  Constitutional: Positive for unexpected weight change (Pt lost 14lbs ). Negative for activity  change, appetite change, chills, diaphoresis, fatigue and fever.  HENT: Negative.   Eyes: Negative.   Respiratory: Positive for shortness of breath. Negative for apnea, cough, choking, chest tightness, wheezing and stridor.   Cardiovascular: Positive for palpitations. Negative for chest pain and leg swelling.  Gastrointestinal: Negative.   Endocrine: Negative.   Genitourinary: Negative.   Musculoskeletal: Negative.   Skin: Negative.   Allergic/Immunologic: Negative.   Neurological: Positive for tremors and light-headedness. Negative for dizziness, seizures, syncope, facial asymmetry, speech difficulty, weakness, numbness and headaches.  Hematological: Negative.   Psychiatric/Behavioral: Negative.        Objective    BP 117/86 (BP Location: Left Arm, Patient Position: Sitting, Cuff Size: Normal)   Pulse 93   Temp 98.4 F (36.9 C) (Oral)   Ht 5\' 8"  (1.727 m)   Wt 160 lb (72.6 kg)   BMI 24.33 kg/m     Physical Exam Vitals reviewed.  Constitutional:      General: She is not in acute distress.    Appearance: Normal appearance. She is well-developed, normal weight and well-nourished. She is not ill-appearing or diaphoretic.  HENT:     Head: Normocephalic and atraumatic.     Right Ear: Tympanic membrane, ear canal and external ear normal.     Left Ear: Tympanic membrane, ear canal and external ear normal.     Mouth/Throat:     Mouth: Oropharynx is clear and moist.  Eyes:     General: No scleral icterus.       Right eye: No discharge.        Left eye: No discharge.     Extraocular Movements: Extraocular movements intact and EOM normal.     Conjunctiva/sclera: Conjunctivae normal.     Pupils: Pupils are equal, round, and reactive to light.  Neck:     Thyroid: No thyromegaly.     Vascular: No carotid bruit or JVD.     Trachea: No tracheal deviation.  Cardiovascular:     Rate and Rhythm: Normal rate and regular rhythm.     Pulses: Normal pulses and intact distal pulses.      Heart sounds: Normal heart sounds. No murmur heard. No friction rub. No gallop.   Pulmonary:     Effort: Pulmonary effort is normal. No respiratory distress.     Breath sounds: Normal breath sounds. No wheezing or rales.  Chest:     Chest wall: No tenderness.  Abdominal:     General: Abdomen is flat. Bowel sounds are normal. There is no distension.     Palpations: Abdomen is soft. There is no mass.     Tenderness: There is no abdominal tenderness. There is no  guarding or rebound.  Musculoskeletal:        General: No tenderness or edema. Normal range of motion.     Cervical back: Normal range of motion and neck supple. No tenderness.     Right lower leg: No edema.     Left lower leg: No edema.  Lymphadenopathy:     Cervical: No cervical adenopathy.  Skin:    General: Skin is warm and dry.     Capillary Refill: Capillary refill takes less than 2 seconds.     Findings: No rash.  Neurological:     General: No focal deficit present.     Mental Status: She is alert and oriented to person, place, and time. Mental status is at baseline.  Psychiatric:        Mood and Affect: Mood and affect and mood normal.        Behavior: Behavior normal.        Thought Content: Thought content normal.        Judgment: Judgment normal.      Last depression screening scores PHQ 2/9 Scores 11/22/2020 10/02/2019 05/28/2018  PHQ - 2 Score 0 0 0  PHQ- 9 Score 2 - -   Last fall risk screening Fall Risk  11/22/2020  Falls in the past year? 0  Number falls in past yr: 0  Injury with Fall? 0  Risk for fall due to : No Fall Risks  Follow up Falls evaluation completed   Last Audit-C alcohol use screening Alcohol Use Disorder Test (AUDIT) 11/22/2020  1. How often do you have a drink containing alcohol? 2  2. How many drinks containing alcohol do you have on a typical day when you are drinking? 0  3. How often do you have six or more drinks on one occasion? 0  AUDIT-C Score 2  Alcohol Brief  Interventions/Follow-up AUDIT Score <7 follow-up not indicated   A score of 3 or more in women, and 4 or more in men indicates increased risk for alcohol abuse, EXCEPT if all of the points are from question 1   No results found for any visits on 11/22/20.  Assessment & Plan    Routine Health Maintenance and Physical Exam  Exercise Activities and Dietary recommendations Goals   None     Immunization History  Administered Date(s) Administered  . Influenza Split 09/05/2013, 08/06/2018  . Influenza, Quadrivalent, Recombinant, Inj, Pf 07/31/2019  . Influenza-Unspecified 07/31/2019  . Tdap 05/13/2009, 12/03/2018    Health Maintenance  Topic Date Due  . PAP SMEAR-Modifier  05/28/2020  . INFLUENZA VACCINE  05/30/2020  . MAMMOGRAM  07/03/2020  . COLONOSCOPY (Pts 45-44yrs Insurance coverage will need to be confirmed)  10/12/2022  . TETANUS/TDAP  12/03/2028  . Hepatitis C Screening  Completed  . HIV Screening  Completed    Discussed health benefits of physical activity, and encouraged her to engage in regular exercise appropriate for her age and condition.  1. Annual physical exam Normal physical exam today. Will check labs as below and f/u pending lab results. If labs are stable and WNL she will not need to have these rechecked for one year at her next annual physical exam. She is to call the office in the meantime if she has any acute issue, questions or concerns. - CBC w/Diff/Platelet - Comprehensive Metabolic Panel (CMET) - TSH - Lipid Panel With LDL/HDL Ratio - HgB A1c  2. Encounter for breast cancer screening using non-mammogram modality Breast exam today was normal. There  is no family history of breast cancer. She does perform regular self breast exams. Mammogram was ordered as below. Information for [facility] was given to patient so she may schedule her mammogram at her convenience.  3. Hypercholesterolemia without hypertriglyceridemia Diet controlled. Will  check labs as below and f/u pending results. - CBC w/Diff/Platelet - Comprehensive Metabolic Panel (CMET) - Lipid Panel With LDL/HDL Ratio - HgB A1c  4. Avitaminosis D H/O this and postmenopausal. Will check labs as below and f/u pending results. - CBC w/Diff/Platelet - Vitamin D (25 hydroxy)  5. History of prediabetes Diet controlled. Will check labs as below and f/u pending results. - Comprehensive Metabolic Panel (CMET) - HgB A1c  6. Palpitations EKG today showed sinus rhythm with LAE and RVR in V1 with no ST segment changes rate of 84. Will check labs as below and f/u pending results. - TSH - EKG 12-Lead - US THYROID; Future  7. Hyperthyroidism Labs did show hyperthyroidism. This could explain the palpitations, hand tremors, and weight loss. Will get [place] as below to evaluate for nodules. Will recheck labs with antibodies in 4 weeks or so. Will refer based on [place] and/or labs.  - US THYROID; Future  8. Weight loss Had felt it may have been secondary to having a GI infection followed by Covid 19. Now with labs showing hyperthyroid this could be the cause of the weight loss. See above plan.  - US THYROID; Future  9. Tremor of both hands Had improved. Felt secondary to stressors. May be possible secondary to hyperthyroid.    No follow-ups on file.     Reynolds Bowl, PA-C, have reviewed all documentation for this visit. The documentation on 11/25/20 for the exam, diagnosis, procedures, and orders are all accurate and complete.   Rubye Beach  [facility] [phone] (phone) [phone] (fax)  [place]

## 2020-11-24 LAB — COMPREHENSIVE METABOLIC PANEL
ALT: 35 IU/L — ABNORMAL HIGH (ref 0–32)
AST: 30 IU/L (ref 0–40)
Albumin/Globulin Ratio: 1.6 (ref 1.2–2.2)
Albumin: 4.1 g/dL (ref 3.8–4.8)
Alkaline Phosphatase: 78 IU/L (ref 44–121)
BUN/Creatinine Ratio: 27 (ref 12–28)
BUN: 13 mg/dL (ref 8–27)
Bilirubin Total: 1.4 mg/dL — ABNORMAL HIGH (ref 0.0–1.2)
CO2: 22 mmol/L (ref 20–29)
Calcium: 9.5 mg/dL (ref 8.7–10.3)
Chloride: 105 mmol/L (ref 96–106)
Creatinine, Ser: 0.48 mg/dL — ABNORMAL LOW (ref 0.57–1.00)
GFR calc Af Amer: 122 mL/min/{1.73_m2} (ref >59)
GFR calc non Af Amer: 106 mL/min/{1.73_m2} (ref >59)
Globulin, Total: 2.5 g/dL (ref 1.5–4.5)
Glucose: 98 mg/dL (ref 65–99)
Potassium: 4.1 mmol/L (ref 3.5–5.2)
Sodium: 141 mmol/L (ref 134–144)
Total Protein: 6.6 g/dL (ref 6.0–8.5)

## 2020-11-24 LAB — CBC WITH DIFFERENTIAL/PLATELET
Basophils Absolute: 0 10*3/uL (ref 0.0–0.2)
Basos: 1 %
EOS (ABSOLUTE): 0.1 10*3/uL (ref 0.0–0.4)
Eos: 1 %
Hematocrit: 40.1 % (ref 34.0–46.6)
Hemoglobin: 13.3 g/dL (ref 11.1–15.9)
Immature Grans (Abs): 0 10*3/uL (ref 0.0–0.1)
Immature Granulocytes: 0 %
Lymphocytes Absolute: 1.4 10*3/uL (ref 0.7–3.1)
Lymphs: 36 %
MCH: 26.4 pg — ABNORMAL LOW (ref 26.6–33.0)
MCHC: 33.2 g/dL (ref 31.5–35.7)
MCV: 80 fL (ref 79–97)
Monocytes Absolute: 0.5 10*3/uL (ref 0.1–0.9)
Monocytes: 13 %
Neutrophils Absolute: 1.9 10*3/uL (ref 1.4–7.0)
Neutrophils: 49 %
Platelets: 205 10*3/uL (ref 150–450)
RBC: 5.04 x10E6/uL (ref 3.77–5.28)
RDW: 13 % (ref 11.7–15.4)
WBC: 3.8 10*3/uL (ref 3.4–10.8)

## 2020-11-24 LAB — LIPID PANEL WITH LDL/HDL RATIO
Cholesterol, Total: 163 mg/dL (ref 100–199)
HDL: 64 mg/dL (ref >39)
LDL Chol Calc (NIH): 84 mg/dL (ref 0–99)
LDL/HDL Ratio: 1.3 ratio (ref 0.0–3.2)
Triglycerides: 78 mg/dL (ref 0–149)
VLDL Cholesterol Cal: 15 mg/dL (ref 5–40)

## 2020-11-24 LAB — HEMOGLOBIN A1C
Est. average glucose Bld gHb Est-mCnc: 117 mg/dL
Hgb A1c MFr Bld: 5.7 % — ABNORMAL HIGH (ref 4.8–5.6)

## 2020-11-24 LAB — TSH: TSH: <0.005 u[IU]/mL — ABNORMAL LOW (ref 0.450–4.500)

## 2020-11-24 LAB — VITAMIN D 25 HYDROXY (VIT D DEFICIENCY, FRACTURES): Vit D, 25-Hydroxy: 51.1 ng/mL (ref 30.0–100.0)

## 2020-11-25 ENCOUNTER — Encounter: Payer: Self-pay | Admitting: Physician Assistant

## 2020-12-02 ENCOUNTER — Other Ambulatory Visit: Payer: Self-pay

## 2020-12-02 ENCOUNTER — Ambulatory Visit
Admission: RE | Admit: 2020-12-02 | Discharge: 2020-12-02 | Disposition: A | Discharge status: Home / Self Care | Payer: BC Managed Care – PPO | Source: Ambulatory Visit | Attending: Physician Assistant | Admitting: Physician Assistant

## 2020-12-02 DIAGNOSIS — R634 Abnormal weight loss: Secondary | ICD-10-CM | POA: Insufficient documentation

## 2020-12-02 DIAGNOSIS — R002 Palpitations: Secondary | ICD-10-CM | POA: Insufficient documentation

## 2020-12-02 DIAGNOSIS — E0590 Thyrotoxicosis, unspecified without thyrotoxic crisis or storm: Secondary | ICD-10-CM

## 2020-12-06 ENCOUNTER — Encounter: Payer: Self-pay | Admitting: Physician Assistant

## 2021-01-03 ENCOUNTER — Encounter: Payer: Self-pay | Admitting: Physician Assistant

## 2021-01-03 DIAGNOSIS — R748 Abnormal levels of other serum enzymes: Secondary | ICD-10-CM

## 2021-01-03 DIAGNOSIS — E069 Thyroiditis, unspecified: Secondary | ICD-10-CM

## 2021-01-11 ENCOUNTER — Encounter: Payer: Self-pay | Admitting: Physician Assistant

## 2021-01-11 DIAGNOSIS — E069 Thyroiditis, unspecified: Secondary | ICD-10-CM

## 2021-01-11 LAB — HEPATIC FUNCTION PANEL
ALT: 23 IU/L (ref 0–32)
AST: 26 IU/L (ref 0–40)
Albumin: 4.2 g/dL (ref 3.8–4.8)
Alkaline Phosphatase: 84 IU/L (ref 44–121)
Bilirubin Total: 1.1 mg/dL (ref 0.0–1.2)
Bilirubin, Direct: 0.27 mg/dL (ref 0.00–0.40)
Total Protein: 6.7 g/dL (ref 6.0–8.5)

## 2021-01-11 LAB — THYROID PEROXIDASE ANTIBODY: Thyroperoxidase Ab SerPl-aCnc: 83 IU/mL — ABNORMAL HIGH (ref 0–34)

## 2021-01-11 LAB — THYROID PANEL WITH TSH
Free Thyroxine Index: 5.6 — ABNORMAL HIGH (ref 1.2–4.9)
T3 Uptake Ratio: 41 % — ABNORMAL HIGH (ref 24–39)
T4, Total: 13.6 ug/dL — ABNORMAL HIGH (ref 4.5–12.0)
TSH: <0.005 u[IU]/mL — ABNORMAL LOW (ref 0.450–4.500)

## 2021-01-11 LAB — THYROTROPIN RECEPTOR AUTOABS: Thyrotropin Receptor Ab: 4.1 IU/L — ABNORMAL HIGH (ref 0.00–1.75)

## 2021-01-18 ENCOUNTER — Other Ambulatory Visit: Payer: Self-pay | Admitting: Physician Assistant

## 2021-01-18 ENCOUNTER — Encounter: Payer: Self-pay | Admitting: Physician Assistant

## 2021-01-18 DIAGNOSIS — Z1231 Encounter for screening mammogram for malignant neoplasm of breast: Secondary | ICD-10-CM

## 2021-01-19 ENCOUNTER — Telehealth: Payer: Self-pay

## 2021-01-19 NOTE — Telephone Encounter (Signed)
Copied from [place] [phone]. Topic: Referral - Medical Records >> Jan 19, 2021  1:25 PM Lennox Solders wrote: Reason for CRM: Pt is calling and dr balan office is waiting on medical records to be fax before making the patient an appt. Please fax to [phone] attn [facility]

## 2021-01-21 ENCOUNTER — Telehealth: Payer: Self-pay | Admitting: Physician Assistant

## 2021-01-21 NOTE — Telephone Encounter (Signed)
OK to send records. Include CPE on 11/22/20, labs and thyroid US please. All are from this year.

## 2021-01-21 NOTE — Telephone Encounter (Signed)
Kieth Brightly with [facility] is calling to request OV notes. Received referral for Endocrology Please advise [phone] Fax- [phone]

## 2021-02-10 ENCOUNTER — Other Ambulatory Visit: Payer: Self-pay

## 2021-02-10 ENCOUNTER — Ambulatory Visit
Admission: RE | Admit: 2021-02-10 | Discharge: 2021-02-10 | Disposition: A | Discharge status: Home / Self Care | Payer: BC Managed Care – PPO | Source: Ambulatory Visit | Attending: Physician Assistant | Admitting: Physician Assistant

## 2021-02-10 DIAGNOSIS — Z1231 Encounter for screening mammogram for malignant neoplasm of breast: Secondary | ICD-10-CM | POA: Diagnosis present

## 2021-02-21 DIAGNOSIS — E0590 Thyrotoxicosis, unspecified without thyrotoxic crisis or storm: Secondary | ICD-10-CM | POA: Insufficient documentation

## 2021-02-24 ENCOUNTER — Telehealth: Payer: BC Managed Care – PPO | Admitting: Physician Assistant

## 2021-02-24 DIAGNOSIS — J208 Acute bronchitis due to other specified organisms: Secondary | ICD-10-CM | POA: Diagnosis not present

## 2021-02-24 DIAGNOSIS — B9689 Other specified bacterial agents as the cause of diseases classified elsewhere: Secondary | ICD-10-CM

## 2021-02-24 MED ORDER — BENZONATATE 200 MG PO CAPS: 200.0000 mg | ORAL_CAPSULE | Freq: Three times a day (TID) | ORAL | 0 refills | Status: DC | PRN

## 2021-02-24 MED ORDER — AZITHROMYCIN 250 MG PO TABS: ORAL_TABLET | ORAL | 0 refills | Status: DC

## 2021-02-24 NOTE — Progress Notes (Signed)
We are sorry that you are not feeling well.  Here is how we plan to help!  Based on your presentation I believe you most likely have A cough due to bacteria.  When patients have a fever and a productive cough with a change in color or increased sputum production, we are concerned about bacterial bronchitis.  If left untreated it can progress to pneumonia.  If your symptoms do not improve with your treatment plan it is important that you contact your provider.   I have prescribed Azithromyin 250 mg: two tablets now and then one tablet daily for 4 additonal days    In addition you may use A prescription cough medication called Tessalon Perles 100mg . You may take 1-2 capsules every 8 hours as needed for your cough.  From your responses in the eVisit questionnaire you describe inflammation in the upper respiratory tract which is causing a significant cough.  This is commonly called Bronchitis and has four common causes:    Allergies  Viral Infections  Acid Reflux  Bacterial Infection Allergies, viruses and acid reflux are treated by controlling symptoms or eliminating the cause. An example might be a cough caused by taking certain blood pressure medications. You stop the cough by changing the medication. Another example might be a cough caused by acid reflux. Controlling the reflux helps control the cough.  USE OF BRONCHODILATOR ("RESCUE") INHALERS: There is a risk from using your bronchodilator too frequently.  The risk is that over-reliance on a medication which only relaxes the muscles surrounding the breathing tubes can reduce the effectiveness of medications prescribed to reduce swelling and congestion of the tubes themselves.  Although you feel brief relief from the bronchodilator inhaler, your asthma may actually be worsening with the tubes becoming more swollen and filled with mucus.  This can delay other crucial treatments, such as oral steroid medications. If you need to use a bronchodilator  inhaler daily, several times per day, you should discuss this with your provider.  There are probably better treatments that could be used to keep your asthma under control.     HOME CARE . Only take medications as instructed by your medical team. . Complete the entire course of an antibiotic. . Drink plenty of fluids and get plenty of rest. . Avoid close contacts especially the very young and the elderly . Cover your mouth if you cough or cough into your sleeve. . Always remember to wash your hands . A steam or ultrasonic humidifier can help congestion.   GET HELP RIGHT AWAY IF: . You develop worsening fever. . You become short of breath . You cough up blood. . Your symptoms persist after you have completed your treatment plan MAKE SURE YOU   Understand these instructions.  Will watch your condition.  Will get help right away if you are not doing well or get worse.  Your e-visit answers were reviewed by a board certified advanced clinical practitioner to complete your personal care plan.  Depending on the condition, your plan could have included both over the counter or prescription medications. If there is a problem please reply  once you have received a response from your provider. Your safety is important to [place].  If you have drug allergies check your prescription carefully.    You can use MyChart to ask questions about today's visit, request a non-urgent call back, or ask for a work or school excuse for 24 hours related to this e-Visit. If it has been greater  than 24 hours you will need to follow up with your provider, or enter a new e-Visit to address those concerns. You will get an e-mail in the next two days asking about your experience.  I hope that your e-visit has been valuable and will speed your recovery. Thank you for using e-visits.  I provided 6 minutes of non face-to-face time during this encounter for chart review and documentation.   

## 2021-07-07 ENCOUNTER — Other Ambulatory Visit: Payer: Self-pay

## 2021-07-07 ENCOUNTER — Ambulatory Visit: Payer: BC Managed Care – PPO | Admitting: Dermatology

## 2021-07-07 DIAGNOSIS — L80 Vitiligo: Secondary | ICD-10-CM

## 2021-07-07 MED ORDER — OPZELURA 1.5 % EX CREA: 1.0000 "application " | TOPICAL_CREAM | Freq: Two times a day (BID) | CUTANEOUS | 2 refills | Status: DC

## 2021-07-07 NOTE — Progress Notes (Signed)
   Follow-Up Visit   Subjective  Rebekah [name] is a 60 y.o. female who presents for the following: Vitiligo (Pt c/o hypopigmentation on hands, chest, knees, and feet. She has not used anything to treat these areas. ). Diagnosed with Graves Disease. Improving with treatment. Under care of Endocrinologist.   The following portions of the chart were reviewed this encounter and updated as appropriate:  Tobacco  Allergies  Meds  Problems  Med Hx  Surg Hx  Fam Hx     Review of Systems: No other skin or systemic complaints except as noted in HPI or Assessment and Plan.  Objective  Well appearing patient in no apparent distress; mood and affect are within normal limits.  A focused examination was performed including hands, chest, knees, and feet. Relevant physical exam findings are noted in the Assessment and Plan.  b/l feet, b/l wrists and hands, b/l knees, chest Depigmented patches            Assessment & Plan  Vitiligo - probably exacerbated by Grave's Thyroid Disease b/l feet, b/l wrists and hands, b/l knees, chest  Reviewed chronic nature, no cure and can be difficult to treat.  Vitiligo is an autoimmune condition which causes loss of skin pigment and is commonly seen on the face and may also involve areas of trauma like hands, elbows, knees, and ankles.  Treatments include topical steroids and other topical anti-inflammatory ointments/creams.  Sometimes narrow band UV light therapy or Xtrac laser is helpful, both of which require twice weekly treatments for at least 3-6 months.  Antioxidant vitamins, such as Vitamins C&E, and alpha lipoic acid may be added to enhance treatment.  Start Opzelura BID to affected areas.  Start a vitamin with vitamins A, C, & E.  Discussed xtrac treatment. Information given today. Will submit for insurance approval.   Photos today.   Ruxolitinib Phosphate (OPZELURA) 1.5 % CREA - b/l feet, b/l wrists and hands, b/l knees, chest Apply 1  application topically 2 (two) times daily.  Return in about 5 months (around 12/07/2021) for vitiligo.  IHarriett Sine, CMA, am acting as scribe for Sarina Ser, MD. Documentation: I have reviewed the above documentation for accuracy and completeness, and I agree with the above.  Sarina Ser, MD

## 2021-07-07 NOTE — Patient Instructions (Signed)
Reviewed chronic nature, no cure and can be difficult to treat.  Vitiligo is an autoimmune condition which causes loss of skin pigment and is commonly seen on the face and may also involve areas of trauma like hands, elbows, knees, and ankles.  Treatments include topical steroids and other topical anti-inflammatory ointments/creams.  Sometimes narrow band UV light therapy or Xtrac laser is helpful, both of which require twice weekly treatments for at least 3-6 months.  Antioxidant vitamins, such as Vitamins C&E, and alpha lipoic acid may be added to enhance treatment.  Start Opzelura BID to affected areas.  Start a vitamin with vitamins A, C, & E.

## 2021-07-12 ENCOUNTER — Encounter: Payer: Self-pay | Admitting: Dermatology

## 2021-07-19 ENCOUNTER — Telehealth: Payer: Self-pay

## 2021-07-19 NOTE — Telephone Encounter (Signed)
Patient advised of information per Dr. Kowalski.  

## 2021-07-19 NOTE — Telephone Encounter (Signed)
Xtrac not covered for diagnosis. [employer] does not cover vitiligo for the laser. Patient advised.

## 2021-12-08 ENCOUNTER — Other Ambulatory Visit: Payer: Self-pay

## 2021-12-08 ENCOUNTER — Ambulatory Visit: Payer: BC Managed Care – PPO | Admitting: Dermatology

## 2021-12-08 DIAGNOSIS — L80 Vitiligo: Secondary | ICD-10-CM | POA: Diagnosis not present

## 2021-12-08 MED ORDER — OPZELURA 1.5 % EX CREA: 1.0000 "application " | TOPICAL_CREAM | Freq: Two times a day (BID) | CUTANEOUS | 11 refills | Status: DC

## 2021-12-08 NOTE — Progress Notes (Signed)
° °  Follow-Up Visit   Subjective  Rebekah [name] is a 61 y.o. female who presents for the following: Vitiligo (Feet, wrist, hands, knees, chest, Opzeluar qd to bid,  didn't start Vitamin A, C, & E).  The following portions of the chart were reviewed this encounter and updated as appropriate:   Tobacco   Allergies   Meds   Problems   Med Hx   Surg Hx   Fam Hx      Review of Systems:  No other skin or systemic complaints except as noted in HPI or Assessment and Plan.  Objective  [name] appearing patient in no apparent distress; mood and affect are within normal limits.  A focused examination was performed including hands, chest, knees, feet. Relevant physical exam findings are noted in the Assessment and Plan.  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  Vitiligo is an autoimmune condition which causes loss of skin pigment and is commonly seen on the face and may also involve areas of trauma like hands, elbows, knees, and ankles.  Treatments include topical steroids and other topical anti-inflammatory ointments/creams and topical and oral Jak inhibitors.  Sometimes narrow band UV light therapy or Xtrac laser is helpful, both of which require twice weekly treatments for at least 3-6 months.  Antioxidant vitamins, such as Vitamins A,C,E,D, Folic Acid and B12 may be added to enhance treatment.  Cont Opzelura cr qd/bid aa vitiligo Start Vitamins A,C,E,C, folic acid and A45  Related Medications Ruxolitinib Phosphate (OPZELURA) 1.5 % CREA Apply 1 application topically 2 (two) times daily. Apply to aa hands, wrist, knees, feet, toes, chest bid   Return in about 7 months (around 07/08/2022) for Vitiligo f/u.  I, Othelia Pulling, RMA, am acting as  scribe for Sarina Ser, MD . Documentation: I have reviewed the above documentation for accuracy and completeness, and I agree with the above.  Sarina Ser, MD

## 2021-12-08 NOTE — Patient Instructions (Addendum)
If You Need Anything After Your Visit ° °If you have any questions or concerns for your doctor, please call our main line at [phone] and press option 4 to reach your doctor's medical assistant. If no one answers, please leave a voicemail as directed and we will return your call as soon as possible. Messages left after 4 pm will be answered the following business day.  ° °You may also send us a message via MyChart. We typically respond to MyChart messages within 1-2 business days. ° °For prescription refills, please ask your pharmacy to contact our office. Our fax number is [phone]. ° °If you have an urgent issue when the clinic is closed that cannot wait until the next business day, you can page your doctor at the number below.   ° °Please note that while we do our best to be available for urgent issues outside of office hours, we are not available 24/7.  ° °If you have an urgent issue and are unable to reach us, you may choose to seek medical care at your doctor's office, retail clinic, urgent care center, or emergency room. ° °If you have a medical emergency, please immediately call 911 or go to the emergency department. ° °Pager Numbers ° °- Dr. Kowalski: [phone] ° °- Dr. Moye: [phone] ° °- Dr. Stewart: [phone] ° °In the event of inclement weather, please call our main line at [phone] for an update on the status of any delays or closures. ° °Dermatology Medication Tips: °Please keep the boxes that topical medications come in in order to help keep track of the instructions about where and how to use these. Pharmacies typically print the medication instructions only on the boxes and not directly on the medication tubes.  ° °If your medication is too expensive, please contact our office at [phone] option 4 or send us a message through MyChart.  ° °We are unable to tell what your co-pay for medications will be in advance as this is different depending on your insurance coverage.  However, we may be able to find a substitute medication at lower cost or fill out paperwork to get insurance to cover a needed medication.  ° °If a prior authorization is required to get your medication covered by your insurance company, please allow us 1-2 business days to complete this process. ° °Drug prices often vary depending on where the prescription is filled and some pharmacies may offer cheaper prices. ° °The website www.goodrx.com contains coupons for medications through different pharmacies. The prices here do not account for what the cost may be with help from insurance (it may be cheaper with your insurance), but the website can give you the price if you did not use any insurance.  °- You can print the associated coupon and take it with your prescription to the pharmacy.  °- You may also stop by our office during regular business hours and pick up a GoodRx coupon card.  °- If you need your prescription sent electronically to a different pharmacy, notify our office through [facility] MyChart or by phone at [phone] option 4. ° ° ° ° °Si Usted Necesita Algo Después de Su Visita ° °También puede enviarnos un mensaje a través de MyChart. Por lo general respondemos a los mensajes de MyChart en el transcurso de 1 a 2 días hábiles. ° °Para renovar recetas, por favor pida a su farmacia que se ponga en contacto con nuestra oficina. Nuestro número de fax es el [phone]. ° °Si tiene   un asunto urgente cuando la clnica est cerrada y que no puede esperar hasta el siguiente da hbil, puede llamar/localizar a su doctor(a) al nmero que aparece a continuacin.   Por favor, tenga en cuenta que aunque hacemos todo lo posible para estar disponibles para asuntos urgentes fuera del horario de [place], no estamos disponibles las 24 horas del da, los 7 das de la [place].   Si tiene un problema urgente y no puede comunicarse con nosotros, puede optar por buscar atencin mdica  en el consultorio de su  doctor(a), en una clnica privada, en un centro de atencin urgente o en una sala de emergencias.  Si tiene Engineering geologist, por favor llame inmediatamente al 911 o vaya a la sala de emergencias.  Nmeros de bper  - Dr. Nehemiah Massed: [phone]  - Dra. Moye: [phone]  - Dra. Nicole Kindred: [phone]  En caso de inclemencias del [place], por favor llame a Johnsie Kindred principal al [phone] para una actualizacin sobre el [place] de cualquier retraso o cierre.  Consejos para la medicacin en dermatologa: Por favor, guarde las cajas en las que vienen los medicamentos de uso tpico para ayudarle a seguir las instrucciones sobre dnde y cmo usarlos. Las farmacias generalmente imprimen las instrucciones del medicamento slo en las cajas y no directamente en los tubos del [place].   Si su medicamento es muy caro, por favor, pngase en contacto con Zigmund Daniel llamando al [phone] y presione la opcin 4 o envenos un mensaje a travs de Pharmacist, community.   No podemos decirle cul ser su copago por los medicamentos por adelantado ya que esto es diferente dependiendo de la cobertura de su seguro. Sin embargo, es posible que podamos encontrar un medicamento sustituto a Electrical engineer un formulario para que el seguro cubra el medicamento que se considera necesario.   Si se requiere una autorizacin previa para que su compaa de seguros [place] su medicamento, por favor permtanos de 1 a 2 das hbiles para completar este proceso.  Los precios de los medicamentos varan con frecuencia dependiendo del Environmental consultant de dnde se surte la receta y alguna farmacias pueden ofrecer precios ms baratos.  El sitio web www.goodrx.com tiene cupones para medicamentos de Airline pilot. Los precios aqu no tienen en cuenta lo que podra costar con la ayuda del seguro (puede ser ms barato con su seguro), pero el sitio web puede darle el precio si no utiliz Research scientist (physical sciences).  - Puede imprimir el cupn  correspondiente y llevarlo con su receta a la farmacia.  - Tambin puede pasar por nuestra oficina durante el horario de atencin regular y Charity fundraiser una tarjeta de cupones de GoodRx.  - Si necesita que su receta se enve electrnicamente a una farmacia diferente, informe a nuestra oficina a travs de MyChart de [place] o por telfono llamando al [phone] y presione la opcin 4.   Antioxidant vitamins, such as Vitamins A,C,E,D, Folic Acid and B12 may be added to enhance treatment

## 2021-12-13 ENCOUNTER — Encounter: Payer: Self-pay | Admitting: Dermatology

## 2021-12-19 ENCOUNTER — Ambulatory Visit (INDEPENDENT_AMBULATORY_CARE_PROVIDER_SITE_OTHER): Payer: BC Managed Care – PPO | Admitting: Family Medicine

## 2021-12-19 ENCOUNTER — Encounter: Payer: Self-pay | Admitting: Family Medicine

## 2021-12-19 ENCOUNTER — Other Ambulatory Visit: Payer: Self-pay

## 2021-12-19 VITALS — BP 123/89 | HR 78 | Temp 98.6°F | Wt 186.0 lb

## 2021-12-19 DIAGNOSIS — E7800 Pure hypercholesterolemia, unspecified: Secondary | ICD-10-CM

## 2021-12-19 DIAGNOSIS — Z0000 Encounter for general adult medical examination without abnormal findings: Secondary | ICD-10-CM

## 2021-12-19 DIAGNOSIS — Z13220 Encounter for screening for lipoid disorders: Secondary | ICD-10-CM | POA: Insufficient documentation

## 2021-12-19 DIAGNOSIS — Z1231 Encounter for screening mammogram for malignant neoplasm of breast: Secondary | ICD-10-CM | POA: Diagnosis not present

## 2021-12-19 DIAGNOSIS — L80 Vitiligo: Secondary | ICD-10-CM

## 2021-12-19 DIAGNOSIS — Z136 Encounter for screening for cardiovascular disorders: Secondary | ICD-10-CM

## 2021-12-19 DIAGNOSIS — E0590 Thyrotoxicosis, unspecified without thyrotoxic crisis or storm: Secondary | ICD-10-CM | POA: Insufficient documentation

## 2021-12-19 NOTE — Assessment & Plan Note (Signed)
Low risk based on current data

## 2021-12-19 NOTE — Assessment & Plan Note (Signed)
UTD on dental °UTD on vision °Things to do to keep yourself healthy  °- Exercise at least 30-45 minutes a day, 3-4 days a week.  °- Eat a low-fat diet with lots of fruits and vegetables, up to 7-9 servings per day.  °- Seatbelts can save your life. Wear them always.  °- Smoke detectors on every level of your home, check batteries every year.  °- Eye Doctor - have an eye exam every 1-2 years  °- Safe sex - if you may be exposed to STDs, use a condom.  °- Alcohol -  If you drink, do it moderately, less than 2 drinks per day.  °- Health Care Power of Attorney. Choose someone to speak for you if you are not able.  °- Depression is common in our stressful world.If you're feeling down or losing interest in things you normally enjoy, please come in for a visit.  °- Violence - If anyone is threatening or hurting you, please call immediately. ° ° °

## 2021-12-19 NOTE — Assessment & Plan Note (Signed)
Currently takes tapazole 5mg - 3x/week; will switch to 5mg  2x/wk in 2 weeks Labs were down with endocrine last month Reported weight gain d/t lack of change in eating patterns since dx

## 2021-12-19 NOTE — Assessment & Plan Note (Signed)
Repeat LP; we recommend diet low in saturated fat and regular exercise - 30 min at least 5 times per week Current risk The 10-year ASCVD risk score (Arnett DK, et al., 2019) is: 3%   Values used to calculate the score:     Age: 61 years     Sex: Female     Is Non-Hispanic African American: No     Diabetic: No     Tobacco smoker: No     Systolic Blood Pressure: 290 mmHg     Is BP treated: No     HDL Cholesterol: 64 mg/dL     Total Cholesterol: 163 mg/dL

## 2021-12-19 NOTE — Assessment & Plan Note (Signed)
Chronic, worsening F/b derm Has started new tx plan May start repigmentation process

## 2021-12-19 NOTE — Progress Notes (Signed)
Argentina Ponder De[place],acting as a scribe for Gwyneth Sprout, FNP.,have documented all relevant documentation on the behalf of Gwyneth Sprout, FNP,as directed by  Gwyneth Sprout, FNP while in the presence of Gwyneth Sprout, FNP.    Complete physical exam   Patient: Rebekah [name]   DOB: 03-21-1961   61 y.o. Female  MRN: [id number] Visit Date: 12/19/2021  Today's healthcare provider: Gwyneth Sprout, FNP   No chief complaint on file.  Subjective    Rebekah [name] is a 61 y.o. female who presents today for a complete physical exam.  She reports consuming a general diet. Gym/ health club routine includes low impact aerobics. She generally feels well. She reports sleeping well. She does not have additional problems to discuss today.  HPI    Past Medical History:  Diagnosis Date   Anemia    Arthritis    Hyperlipidemia    Motion sickness    back seat of cars   Varicose veins    Vitamin D deficiency    Vitiligo    Wears contact lenses    Past Surgical History:  Procedure Laterality Date   COLONOSCOPY  09/19/2012   COLONOSCOPY WITH PROPOFOL N/A 10/12/2017   Procedure: COLONOSCOPY WITH PROPOFOL;  Surgeon: Lucilla Lame, MD;  Location: [facility];  Service: Endoscopy;  Laterality: N/A;   COLPOSCOPY  2000   Social History   Socioeconomic History   Marital status: Married    Spouse name: Hubbard Robinson   Number of children: 3   Years of education: 16   Highest education level: Bachelor's degree (e.g., BA, AB, BS)  Occupational History   Occupation: Associate Professor: [employer]    Comment: [employer]  Tobacco Use   Smoking status: Never   Smokeless tobacco: Never  Vaping Use   Vaping Use: Never used  Substance and Sexual Activity   Alcohol use: Yes    Alcohol/week: 1.0 standard drink    Types: 1 Glasses of wine per week    Comment: 1 drink 2-4 times per month   Drug use: No   Sexual activity: Not on file  Other Topics Concern   Not on file  Social  History Narrative   Not on file   Social Determinants of Health   Financial Resource Strain: Not on file  Food Insecurity: Not on file  Transportation Needs: Not on file  Physical Activity: Not on file  Stress: Not on file  Social Connections: Not on file  Intimate Partner Violence: Not on file   Family Status  Relation Name Status   Mother  Deceased   Father  Deceased   Brother  Deceased   Ethlyn Daniels  (Not Specified)   Neg Hx  (Not Specified)   Family History  Problem Relation Age of Onset   Cancer Father        lung cancer   Melanoma Father    Hypertension Father    Cancer Brother        colon cancer   Alcohol abuse Brother    Ovarian cancer Paternal Aunt    Breast cancer Neg Hx    No Known Allergies  Patient Care Team: Gwyneth Sprout, FNP as PCP - General (Family Medicine)   Medications: Outpatient Medications Prior to Visit  Medication Sig   IBUPROFEN PO Take by mouth as needed.   methimazole (TAPAZOLE) 5 MG tablet Take 5 mg by mouth 3 (three) times daily.  Ruxolitinib Phosphate (OPZELURA) 1.5 % CREA Apply 1 application topically 2 (two) times daily. Apply to aa hands, wrist, knees, feet, toes, chest bid   Zinc 100 MG TABS Take by mouth.   [DISCONTINUED] azithromycin (ZITHROMAX) 250 MG tablet Take 2 tablets PO on day one, and one tablet PO daily thereafter until completed.   [DISCONTINUED] benzonatate (TESSALON) 200 MG capsule Take 1 capsule (200 mg total) by mouth 3 (three) times daily as needed for cough.   No facility-administered medications prior to visit.    Review of Systems  Constitutional: Negative.   HENT: Negative.    Eyes: Negative.   Respiratory: Negative.    Cardiovascular: Negative.   Gastrointestinal: Negative.   Endocrine: Negative.   Genitourinary: Negative.   Musculoskeletal: Negative.   Skin:  Positive for color change (vitiligo).  Allergic/Immunologic: Positive for immunocompromised state.  Neurological:  Positive for numbness  (hands when sleeping).  Hematological: Negative.   Psychiatric/Behavioral: Negative.       Objective    BP 123/89 (BP Location: Right Arm, Patient Position: Sitting, Cuff Size: Normal)    Pulse 78    Temp 98.6 F (37 C) (Oral)    Wt 186 lb (84.4 kg)    SpO2 98%    BMI 28.28 kg/m    Physical Exam Vitals and nursing note reviewed.  Constitutional:      General: She is awake. She is not in acute distress.    Appearance: Normal appearance. She is well-developed, well-groomed and overweight. She is not ill-appearing, toxic-appearing or diaphoretic.  HENT:     Head: Normocephalic and atraumatic.     Jaw: There is normal jaw occlusion. No trismus, tenderness, swelling or pain on movement.     Right Ear: Hearing, tympanic membrane, ear canal and external ear normal. There is no impacted cerumen.     Left Ear: Hearing, tympanic membrane, ear canal and external ear normal. There is no impacted cerumen.     Nose: Nose normal. No congestion or rhinorrhea.     Right Turbinates: Not enlarged, swollen or pale.     Left Turbinates: Not enlarged, swollen or pale.     Right Sinus: No maxillary sinus tenderness or frontal sinus tenderness.     Left Sinus: No maxillary sinus tenderness or frontal sinus tenderness.     Mouth/Throat:     Lips: Pink.     Mouth: Mucous membranes are moist. No injury.     Tongue: No lesions.     Pharynx: Oropharynx is clear. Uvula midline. No pharyngeal swelling, oropharyngeal exudate, posterior oropharyngeal erythema or uvula swelling.     Tonsils: No tonsillar exudate or tonsillar abscesses.  Eyes:     General: Lids are normal. Lids are everted, no foreign bodies appreciated. Vision grossly intact. Gaze aligned appropriately. No allergic shiner or visual field deficit.       Right eye: No discharge.        Left eye: No discharge.     Extraocular Movements: Extraocular movements intact.     Conjunctiva/sclera: Conjunctivae normal.     Right eye: Right conjunctiva is  not injected. No exudate.    Left eye: Left conjunctiva is not injected. No exudate.    Pupils: Pupils are equal, round, and reactive to light.  Neck:     Thyroid: No thyroid mass, thyromegaly or thyroid tenderness.     Vascular: No carotid bruit.     Trachea: Trachea normal.  Cardiovascular:     Rate and Rhythm: Normal rate and  regular rhythm.     Pulses: Normal pulses.          Carotid pulses are 2+ on the right side and 2+ on the left side.      Radial pulses are 2+ on the right side and 2+ on the left side.       Dorsalis pedis pulses are 2+ on the right side and 2+ on the left side.       Posterior tibial pulses are 2+ on the right side and 2+ on the left side.     Heart sounds: Normal heart sounds, S1 normal and S2 normal. No murmur heard.   No friction rub. No gallop.  Pulmonary:     Effort: Pulmonary effort is normal. No respiratory distress.     Breath sounds: Normal breath sounds and air entry. No stridor. No wheezing, rhonchi or rales.  Chest:     Chest wall: No tenderness.     Comments: Breasts: breasts appear normal, no suspicious masses, no skin or nipple changes or axillary nodes, symmetric fibrous changes in both upper outer quadrants, right breast normal without mass, skin or nipple changes or axillary nodes, left breast normal without mass, skin or nipple changes or axillary nodes, risk and benefit of breast self-exam was discussed discussed 'know your lemons' campaign and self exam Abdominal:     General: Abdomen is flat. Bowel sounds are normal. There is no distension.     Palpations: Abdomen is soft. There is no mass.     Tenderness: There is no abdominal tenderness. There is no right CVA tenderness, left CVA tenderness, guarding or rebound.     Hernia: No hernia is present.  Genitourinary:    Comments: Exam deferred; denies complaints Musculoskeletal:        General: No swelling, tenderness, deformity or signs of injury. Normal range of motion.     Cervical back:  Full passive range of motion without pain, normal range of motion and neck supple. No edema, rigidity or tenderness. No muscular tenderness.     Right lower leg: No edema.     Left lower leg: No edema.  Lymphadenopathy:     Cervical: No cervical adenopathy.     Right cervical: No superficial, deep or posterior cervical adenopathy.    Left cervical: No superficial, deep or posterior cervical adenopathy.  Skin:    General: Skin is warm and dry.     Capillary Refill: Capillary refill takes less than 2 seconds.     Coloration: Skin is not jaundiced or pale.     Findings: No bruising, erythema, lesion or rash.  Neurological:     General: No focal deficit present.     Mental Status: She is alert and oriented to person, place, and time. Mental status is at baseline.     GCS: GCS eye subscore is 4. GCS verbal subscore is 5. GCS motor subscore is 6.     Sensory: Sensation is intact. No sensory deficit.     Motor: Motor function is intact. No weakness.     Coordination: Coordination is intact. Coordination normal.     Gait: Gait is intact. Gait normal.  Psychiatric:        Attention and Perception: Attention and perception normal.        Mood and Affect: Mood and affect normal.        Speech: Speech normal.        Behavior: Behavior normal. Behavior is cooperative.  Thought Content: Thought content normal.        Cognition and Memory: Cognition and memory normal.        Judgment: Judgment normal.     Last depression screening scores PHQ 2/9 Scores 12/19/2021 11/22/2020 10/02/2019  PHQ - 2 Score 0 0 0  PHQ- 9 Score 0 2 -   Last fall risk screening Fall Risk  12/19/2021  Falls in the past year? 0  Number falls in past yr: -  Injury with Fall? -  Risk for fall due to : -  Follow up -   Last Audit-C alcohol use screening Alcohol Use Disorder Test (AUDIT) 12/19/2021  1. How often do you have a drink containing alcohol? 2  2. How many drinks containing alcohol do you have on a typical  day when you are drinking? 0  3. How often do you have six or more drinks on one occasion? 0  AUDIT-C Score 2  Alcohol Brief Interventions/Follow-up -   A score of 3 or more in women, and 4 or more in men indicates increased risk for alcohol abuse, EXCEPT if all of the points are from question 1   No results found for any visits on 12/19/21.  Assessment & Plan    Routine Health Maintenance and Physical Exam  Exercise Activities and Dietary recommendations  Goals   None     Immunization History  Administered Date(s) Administered   Influenza Split 09/05/2013, 08/06/2018   Influenza, Quadrivalent, Recombinant, Inj, Pf 07/31/2019   Influenza-Unspecified 07/31/2019, 07/19/2020, 08/16/2021   PFIZER Comirnaty(Gray Top)Covid-19 Tri-Sucrose Vaccine 01/06/2020, 01/27/2020   Pfizer Covid-19 Vaccine Bivalent Booster 91yrs & up 08/16/2021   Tdap 05/13/2009, 12/03/2018   Zoster Recombinat (Shingrix) 11/22/2021    Health Maintenance  Topic Date Due   Zoster Vaccines- Shingrix (2 of 2) 01/17/2022   PAP SMEAR-Modifier  05/28/2022   COLONOSCOPY (Pts 45-53yrs Insurance coverage will need to be confirmed)  10/12/2022   MAMMOGRAM  02/11/2023   TETANUS/TDAP  12/03/2028   INFLUENZA VACCINE  Completed   COVID-19 Vaccine  Completed   Hepatitis C Screening  Completed   HIV Screening  Completed   HPV VACCINES  Aged Out    Discussed health benefits of physical activity, and encouraged her to engage in regular exercise appropriate for her age and condition.  Problem List Items Addressed This Visit       Endocrine   Hyperthyroidism    Currently takes tapazole 5mg - 3x/week; will switch to 5mg  2x/wk in 2 weeks Labs were down with endocrine last month Reported weight gain d/t lack of change in eating patterns since dx       Relevant Medications   methimazole (TAPAZOLE) 5 MG tablet     Musculoskeletal and Integument   Vitiligo    Chronic, worsening F/b derm Has started new tx plan May  start repigmentation process        Other   Hypercholesterolemia without hypertriglyceridemia    Repeat LP; we recommend diet low in saturated fat and regular exercise - 30 min at least 5 times per week Current risk The 10-year ASCVD risk score (Arnett DK, et al., 2019) is: 3%   Values used to calculate the score:     Age: 37 years     Sex: Female     Is Non-Hispanic African American: No     Diabetic: No     Tobacco smoker: No     Systolic Blood Pressure: 431 mmHg     Is  BP treated: No     HDL Cholesterol: 64 mg/dL     Total Cholesterol: 163 mg/dL       Relevant Orders   Lipid Panel With LDL/HDL Ratio   Annual physical exam    UTD on dental UTD on vision Things to do to keep yourself healthy  - Exercise at least 30-45 minutes a day, 3-4 days a week.  - Eat a low-fat diet with lots of fruits and vegetables, up to 7-9 servings per day.  - Seatbelts can save your life. Wear them always.  - Smoke detectors on every level of your home, check batteries every year.  - Eye Doctor - have an eye exam every 1-2 years  - Safe sex - if you may be exposed to STDs, use a condom.  - Alcohol -  If you drink, do it moderately, less than 2 drinks per day.  - [place]. Choose someone to speak for you if you are not able.  - Depression is common in our stressful world.If you're feeling down or losing interest in things you normally enjoy, please come in for a visit.  - Violence - If anyone is threatening or hurting you, please call immediately.        Relevant Orders   Comprehensive metabolic panel   Encounter for lipid screening for cardiovascular disease - Primary    Low risk based on current data      Relevant Orders   Lipid Panel With LDL/HDL Ratio   Breast cancer screening by mammogram    Due for screening mammo 4/15      Relevant Orders   MM 3D SCREEN BREAST BILATERAL     Return in about 1 year (around 12/19/2022) for annual examination.     Vonna Kotyk, FNP, have reviewed all documentation for this visit. The documentation on 12/19/21 for the exam, diagnosis, procedures, and orders are all accurate and complete.    Gwyneth Sprout, [place] [phone] (phone) [phone] (fax)  [place]

## 2021-12-19 NOTE — Assessment & Plan Note (Signed)
Due for screening mammo 4/15

## 2021-12-20 LAB — COMPREHENSIVE METABOLIC PANEL
ALT: 18 IU/L (ref 0–32)
AST: 25 IU/L (ref 0–40)
Albumin/Globulin Ratio: 2.5 — ABNORMAL HIGH (ref 1.2–2.2)
Albumin: 4.7 g/dL (ref 3.8–4.8)
Alkaline Phosphatase: 94 IU/L (ref 44–121)
BUN/Creatinine Ratio: 24 (ref 12–28)
BUN: 15 mg/dL (ref 8–27)
Bilirubin Total: 0.7 mg/dL (ref 0.0–1.2)
CO2: 21 mmol/L (ref 20–29)
Calcium: 9 mg/dL (ref 8.7–10.3)
Chloride: 107 mmol/L — ABNORMAL HIGH (ref 96–106)
Creatinine, Ser: 0.62 mg/dL (ref 0.57–1.00)
Globulin, Total: 1.9 g/dL (ref 1.5–4.5)
Glucose: 88 mg/dL (ref 70–99)
Potassium: 4.3 mmol/L (ref 3.5–5.2)
Sodium: 143 mmol/L (ref 134–144)
Total Protein: 6.6 g/dL (ref 6.0–8.5)
eGFR: 101 mL/min/{1.73_m2} (ref >59)

## 2021-12-20 LAB — LIPID PANEL WITH LDL/HDL RATIO
Cholesterol, Total: 247 mg/dL — ABNORMAL HIGH (ref 100–199)
HDL: 83 mg/dL (ref >39)
LDL Chol Calc (NIH): 149 mg/dL — ABNORMAL HIGH (ref 0–99)
LDL/HDL Ratio: 1.8 ratio (ref 0.0–3.2)
Triglycerides: 91 mg/dL (ref 0–149)
VLDL Cholesterol Cal: 15 mg/dL (ref 5–40)

## 2022-03-23 ENCOUNTER — Ambulatory Visit
Admission: RE | Admit: 2022-03-23 | Discharge: 2022-03-23 | Disposition: A | Discharge status: Home / Self Care | Payer: BC Managed Care – PPO | Source: Ambulatory Visit | Attending: Family Medicine | Admitting: Family Medicine

## 2022-03-23 DIAGNOSIS — Z1231 Encounter for screening mammogram for malignant neoplasm of breast: Secondary | ICD-10-CM | POA: Insufficient documentation

## 2022-05-31 IMAGING — US US THYROID
1 series · 13 of 25 positions shown · non-contrast
Comparison: None.

CLINICAL DATA: New hyperthyroidism.  Unexplained weight loss

EXAM:
THYROID ULTRASOUND
TECHNIQUE: Ultrasound examination of the thyroid gland and adjacent soft
tissues was performed.

[Series 1: us thyroid · 0.07mm/px · 13 of 71 slices shown]
[im 1/71]
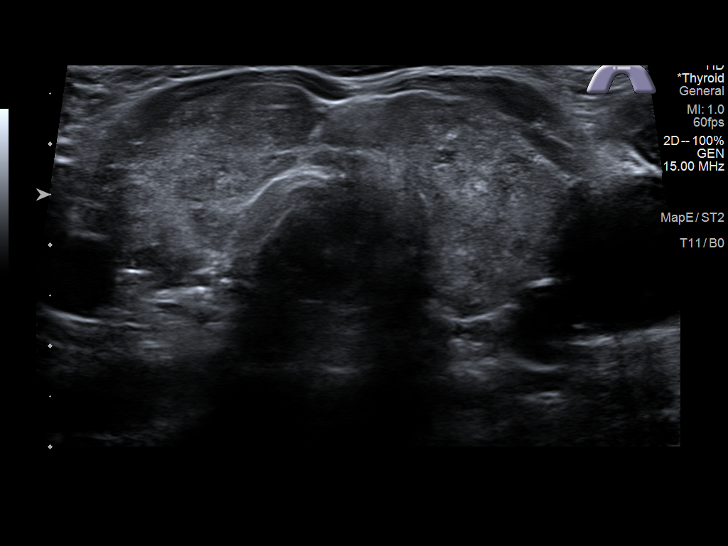
[im 6/71]
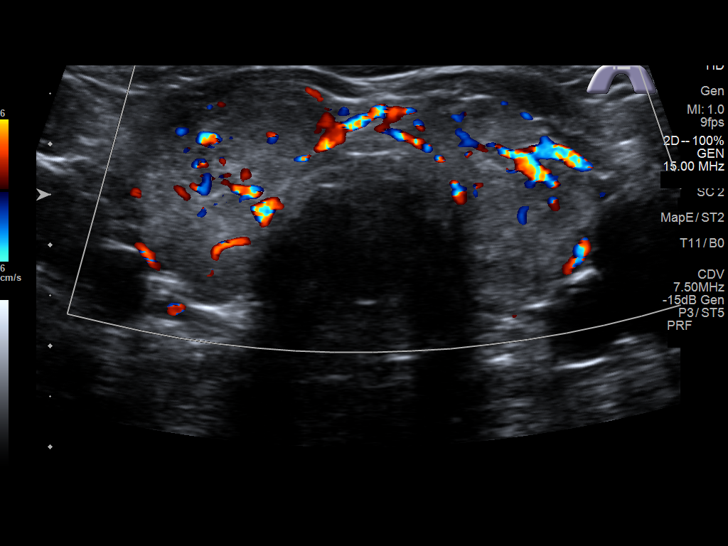
[im 12/71]
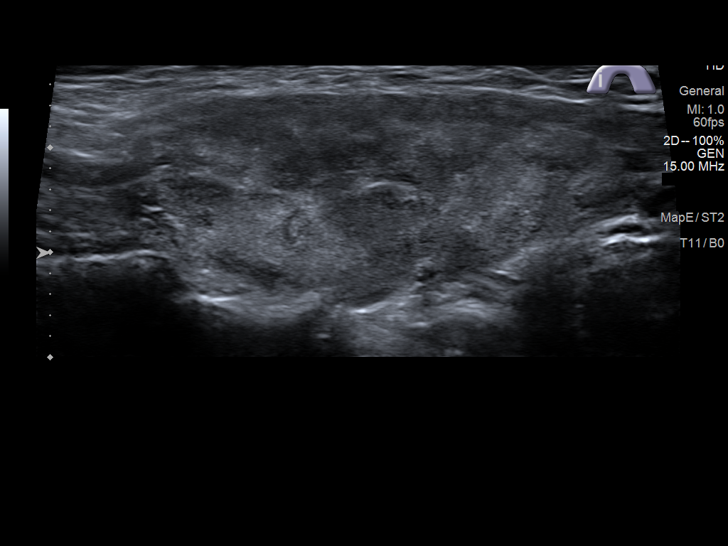
[im 18/71]
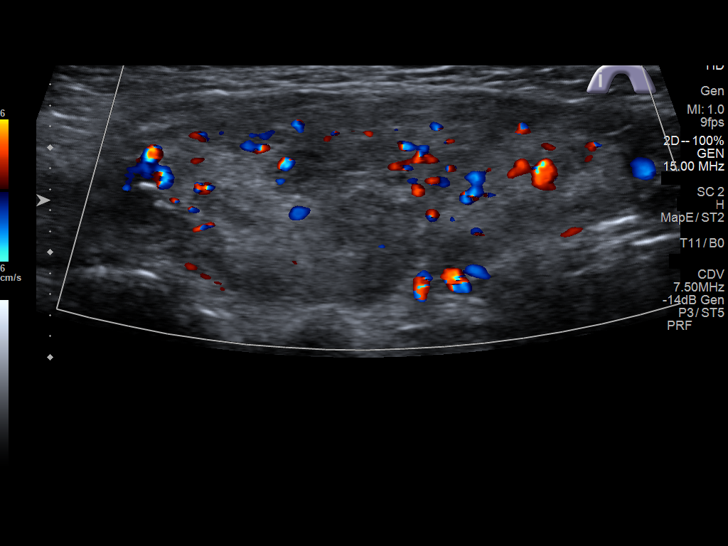
[im 24/71]
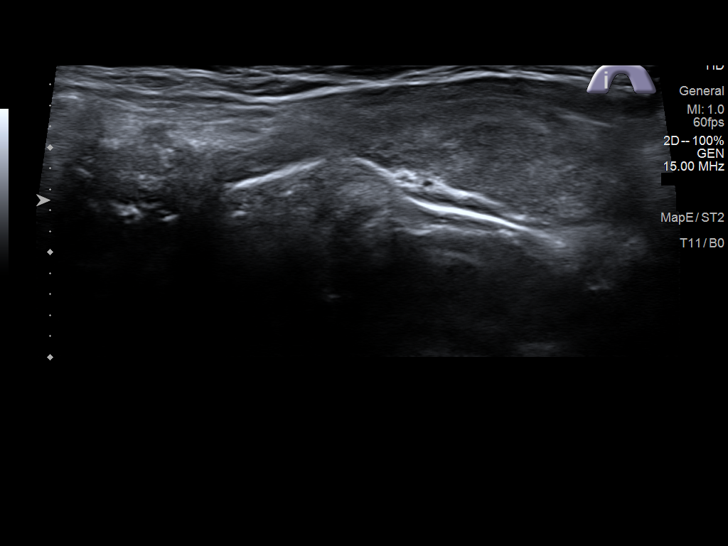
[im 30/71]
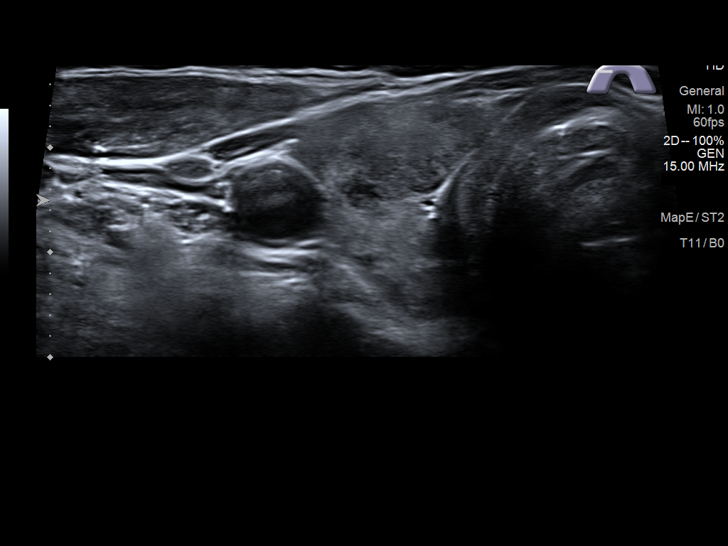
[im 36/71]
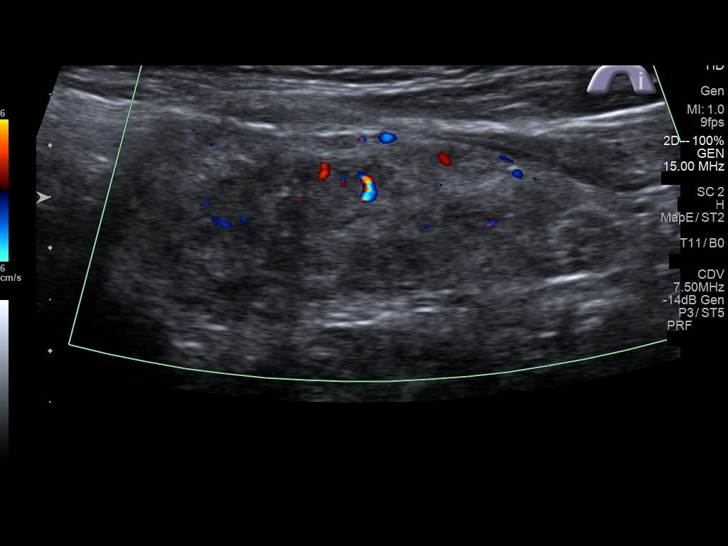
[im 41/71]
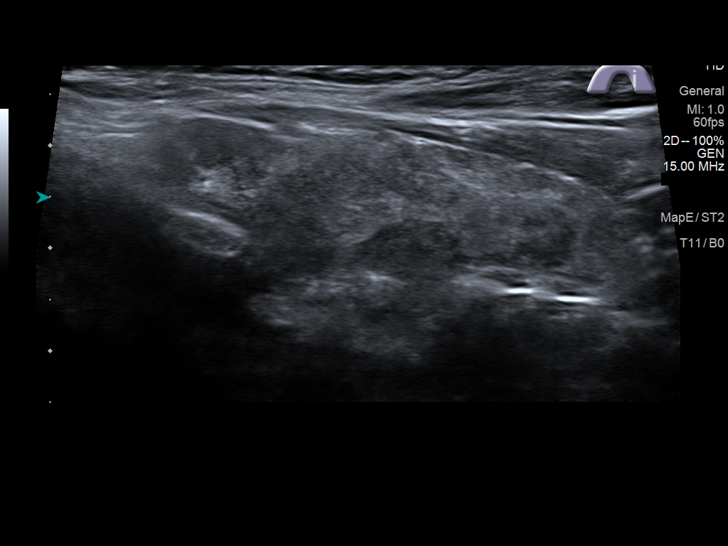
[im 47/71]
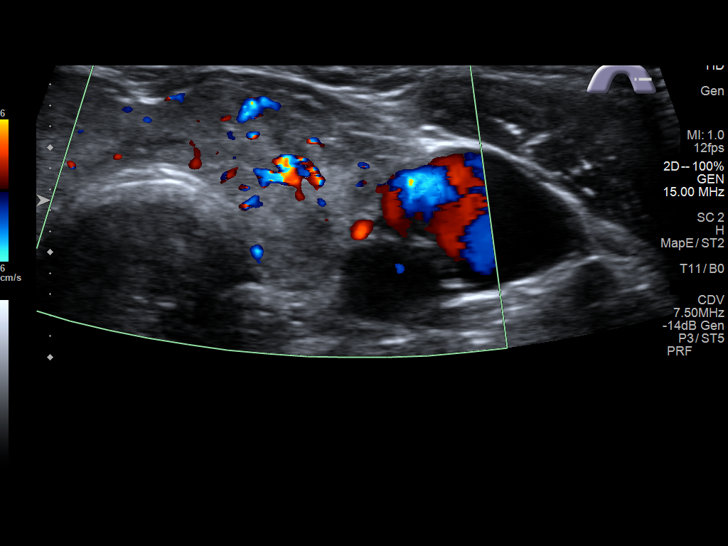
[im 53/71]
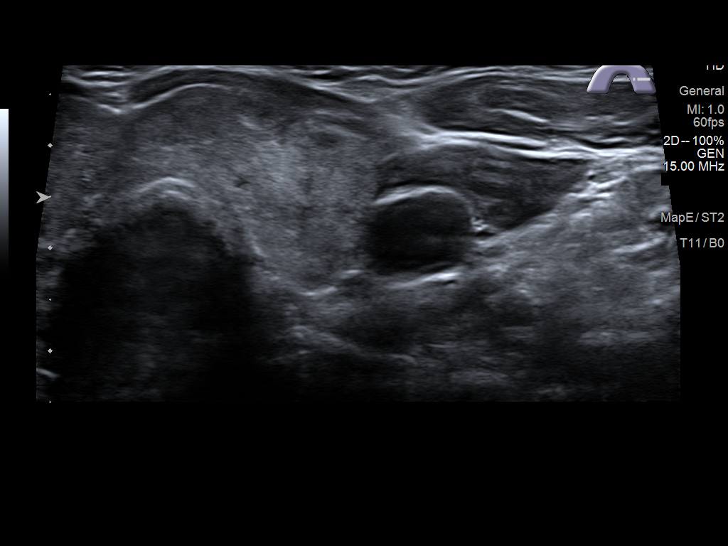
[im 59/71]
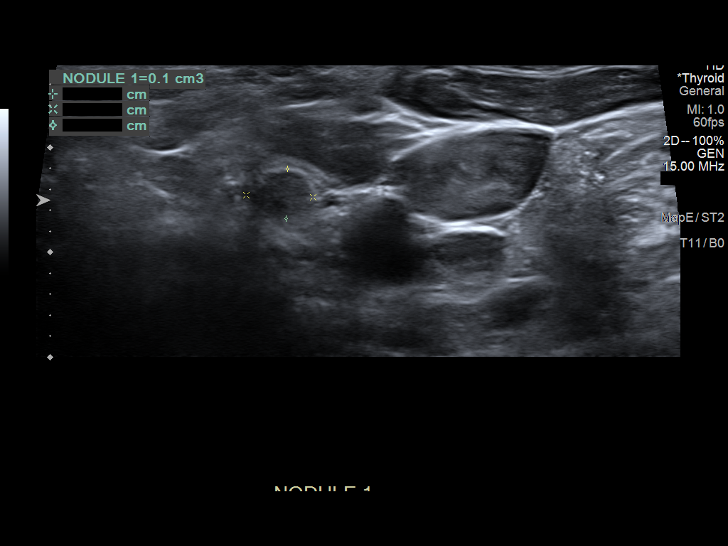
[im 65/71]
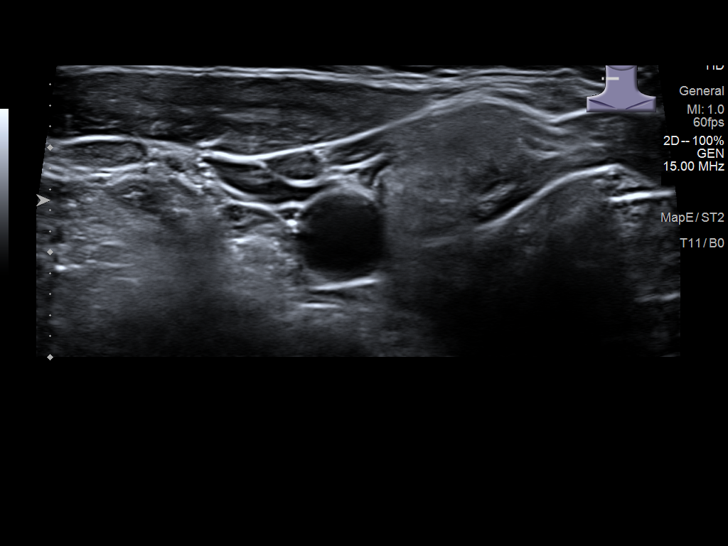
[im 71/71]
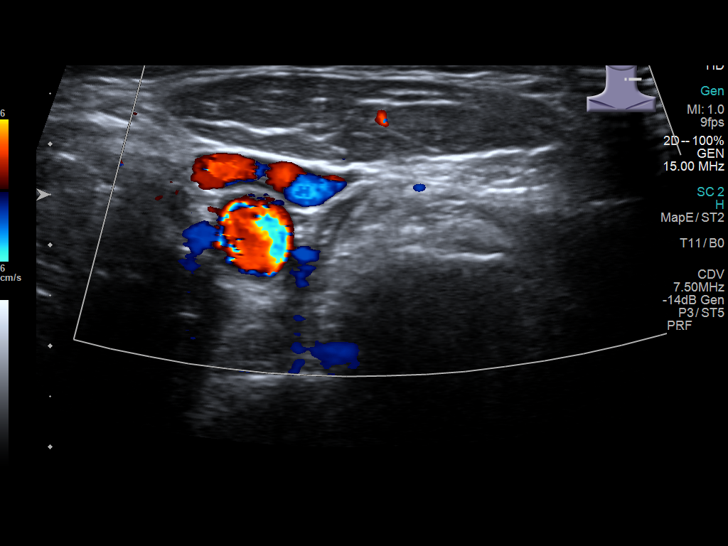

[13 of 25 positions shown; findings below may reference images not displayed]

FINDINGS: Parenchymal Echotexture: Markedly heterogenous

Isthmus: 0.6 cm

Right lobe: 4.9 x 2.1 x 1.6 cm

Left lobe: 5.3 x 1.5 x 1.8 cm

_________________________________________________________

Estimated total number of nodules >/= 1 cm: 0

Number of spongiform nodules >/=  2 cm not described below (TR1): 0

Number of mixed cystic and solid nodules >/= 1.5 cm not described
below (TR2): 0

_________________________________________________________

There is a subcentimeter 0.8 cm thyroid nodule in the left inferior
thyroid gland. This thyroid nodule appears to be at least partially
cystic and does not meet criteria for follow-up or fine-needle
aspiration. The thyroid gland is markedly heterogeneous and
hypervascular.
IMPRESSION: 1. Markedly heterogeneous and hypervascular thyroid gland. This is
nonspecific but can be seen in patients with thyroiditis.
2. Subcentimeter left inferior thyroid nodule that does not meet
criteria for follow-up or fine-needle aspiration.

The above is in keeping with the ACR TI-RADS recommendations - [HOSPITAL] 6155;[DATE].

## 2022-07-13 ENCOUNTER — Ambulatory Visit: Payer: BC Managed Care – PPO | Admitting: Dermatology

## 2022-07-13 DIAGNOSIS — Z79899 Other long term (current) drug therapy: Secondary | ICD-10-CM

## 2022-07-13 DIAGNOSIS — L80 Vitiligo: Secondary | ICD-10-CM

## 2022-07-13 MED ORDER — OPZELURA 1.5 % EX CREA: 1.0000 "application " | TOPICAL_CREAM | Freq: Two times a day (BID) | CUTANEOUS | 11 refills | Status: DC

## 2022-07-13 NOTE — Patient Instructions (Signed)
Due to recent changes in healthcare laws, you may see results of your pathology and/or laboratory studies on MyChart before the doctors have had a chance to review them. We understand that in some cases there may be results that are confusing or concerning to you. Please understand that not all results are received at the same time and often the doctors may need to interpret multiple results in order to provide you with the best plan of care or course of treatment. Therefore, we ask that you please give us 2 business days to thoroughly review all your results before contacting the office for clarification. Should we see a critical lab result, you will be contacted sooner.   If You Need Anything After Your Visit  If you have any questions or concerns for your doctor, please call our main line at [phone] and press option 4 to reach your doctor's medical assistant. If no one answers, please leave a voicemail as directed and we will return your call as soon as possible. Messages left after 4 pm will be answered the following business day.   You may also send us a message via MyChart. We typically respond to MyChart messages within 1-2 business days.  For prescription refills, please ask your pharmacy to contact our office. Our fax number is [phone].  If you have an urgent issue when the clinic is closed that cannot wait until the next business day, you can page your doctor at the number below.    Please note that while we do our best to be available for urgent issues outside of office hours, we are not available 24/7.   If you have an urgent issue and are unable to reach us, you may choose to seek medical care at your doctor's office, retail clinic, urgent care center, or emergency room.  If you have a medical emergency, please immediately call 911 or go to the emergency department.  Pager Numbers  - Dr. Kowalski: [phone]  - Dr. Moye: [phone]  - Dr. Stewart:  [phone]  In the event of inclement weather, please call our main line at [phone] for an update on the status of any delays or closures.  Dermatology Medication Tips: Please keep the boxes that topical medications come in in order to help keep track of the instructions about where and how to use these. Pharmacies typically print the medication instructions only on the boxes and not directly on the medication tubes.   If your medication is too expensive, please contact our office at [phone] option 4 or send us a message through MyChart.   We are unable to tell what your co-pay for medications will be in advance as this is different depending on your insurance coverage. However, we may be able to find a substitute medication at lower cost or fill out paperwork to get insurance to cover a needed medication.   If a prior authorization is required to get your medication covered by your insurance company, please allow us 1-2 business days to complete this process.  Drug prices often vary depending on where the prescription is filled and some pharmacies may offer cheaper prices.  The website www.goodrx.com contains coupons for medications through different pharmacies. The prices here do not account for what the cost may be with help from insurance (it may be cheaper with your insurance), but the website can give you the price if you did not use any insurance.  - You can print the associated coupon and take it with   your prescription to the pharmacy.  - You may also stop by our office during regular business hours and pick up a GoodRx coupon card.  - If you need your prescription sent electronically to a different pharmacy, notify our office through [facility] MyChart or by phone at [phone] option 4.     Si Usted Necesita Algo Despus de Su Visita  Tambin puede enviarnos un mensaje a travs de MyChart. Por lo general respondemos a los mensajes de MyChart en el transcurso de 1 a 2  das hbiles.  Para renovar recetas, por favor pida a su farmacia que se ponga en contacto con nuestra oficina. Nuestro nmero de fax es el [phone].  Si tiene un asunto urgente cuando la clnica est cerrada y que no puede esperar hasta el siguiente da hbil, puede llamar/localizar a su doctor(a) al nmero que aparece a continuacin.   Por favor, tenga en cuenta que aunque hacemos todo lo posible para estar disponibles para asuntos urgentes fuera del horario de oficina, no estamos disponibles las 24 horas del da, los 7 das de la semana.   Si tiene un problema urgente y no puede comunicarse con nosotros, puede optar por buscar atencin mdica  en el consultorio de su doctor(a), en una clnica privada, en un centro de atencin urgente o en una sala de emergencias.  Si tiene una emergencia mdica, por favor llame inmediatamente al 911 o vaya a la sala de emergencias.  Nmeros de bper  - Dr. Kowalski: [phone]  - Dra. Moye: [phone]  - Dra. Stewart: [phone]  En caso de inclemencias del tiempo, por favor llame a nuestra lnea principal al [phone] para una actualizacin sobre el estado de cualquier retraso o cierre.  Consejos para la medicacin en dermatologa: Por favor, guarde las cajas en las que vienen los medicamentos de uso tpico para ayudarle a seguir las instrucciones sobre dnde y cmo usarlos. Las farmacias generalmente imprimen las instrucciones del medicamento slo en las cajas y no directamente en los tubos del medicamento.   Si su medicamento es muy caro, por favor, pngase en contacto con nuestra oficina llamando al [phone] y presione la opcin 4 o envenos un mensaje a travs de MyChart.   No podemos decirle cul ser su copago por los medicamentos por adelantado ya que esto es diferente dependiendo de la cobertura de su seguro. Sin embargo, es posible que podamos encontrar un medicamento sustituto a menor costo o llenar un formulario para que el  seguro cubra el medicamento que se considera necesario.   Si se requiere una autorizacin previa para que su compaa de seguros cubra su medicamento, por favor permtanos de 1 a 2 das hbiles para completar este proceso.  Los precios de los medicamentos varan con frecuencia dependiendo del lugar de dnde se surte la receta y alguna farmacias pueden ofrecer precios ms baratos.  El sitio web www.goodrx.com tiene cupones para medicamentos de diferentes farmacias. Los precios aqu no tienen en cuenta lo que podra costar con la ayuda del seguro (puede ser ms barato con su seguro), pero el sitio web puede darle el precio si no utiliz ningn seguro.  - Puede imprimir el cupn correspondiente y llevarlo con su receta a la farmacia.  - Tambin puede pasar por nuestra oficina durante el horario de atencin regular y recoger una tarjeta de cupones de GoodRx.  - Si necesita que su receta se enve electrnicamente a una farmacia diferente, informe a nuestra oficina a travs de MyChart de [facility]   o por telfono llamando al [phone] y presione la opcin 4.  

## 2022-07-13 NOTE — Progress Notes (Unsigned)
   Follow-Up Visit   Subjective  Rebekah [name] is a 61 y.o. female who presents for the following: Follow-up (7 months f/u on Vitiligo hands,feet,knees and chest treating with Opzelura cream with a fair response).  The following portions of the chart were reviewed this encounter and updated as appropriate:   Tobacco  Allergies  Meds  Problems  Med Hx  Surg Hx  Fam Hx     Review of Systems:  No other skin or systemic complaints except as noted in HPI or Assessment and Plan.  Objective  Well appearing patient in no apparent distress; mood and affect are within normal limits.  A focused examination was performed including hands, feet, knees, arms. Relevant physical exam findings are noted in the Assessment and Plan.  chest, arms, wrist, knees, feet, hands Depigmented patches                          Assessment & Plan  Vitiligo chest, arms, wrist, knees, feet, hands  Probably exacerbated by Grave's Thyroid Disease Minimal improvement  Chronic and persistent condition with duration or expected duration over one year. Condition is symptomatic / bothersome to patient. Not to goal.  Reviewed chronic nature, no cure and can be difficult to treat.  Vitiligo is an autoimmune condition which causes loss of skin pigment and is commonly seen on the face and may also involve areas of trauma like hands, elbows, knees, and ankles.  Treatments include topical steroids and other topical anti-inflammatory ointments/creams and topical and oral Jak inhibitors.  Sometimes narrow band UV light therapy or Xtrac laser is helpful, both of which require twice weekly treatments for at least 3-6 months.  Antioxidant vitamins, such as Vitamins A,C,E,D, Folic Acid and B12 may be added to enhance treatment.   Cont Opzelura cr qd/bid aa vitiligo Start  Vitamins A,C,E,C, folic acid and G25  Related Medications Ruxolitinib Phosphate (OPZELURA) 1.5 % CREA Apply 1 application   topically 2 (two) times daily. Apply to aa hands, wrist, knees, feet, toes, chest bid  Return in about 1 year (around 07/14/2023) for Vitiligo .  IMarye Round, CMA, am acting as scribe for Sarina Ser, MD .  Documentation: I have reviewed the above documentation for accuracy and completeness, and I agree with the above.  Sarina Ser, MD

## 2022-07-16 ENCOUNTER — Encounter: Payer: Self-pay | Admitting: Dermatology

## 2022-07-27 ENCOUNTER — Other Ambulatory Visit: Payer: Self-pay | Admitting: Family Medicine

## 2022-07-27 ENCOUNTER — Encounter: Payer: Self-pay | Admitting: Family Medicine

## 2022-07-27 MED ORDER — BENZONATATE 200 MG PO CAPS: 200.0000 mg | ORAL_CAPSULE | Freq: Two times a day (BID) | ORAL | 0 refills | Status: DC | PRN

## 2023-02-28 ENCOUNTER — Telehealth: Payer: Self-pay

## 2023-02-28 NOTE — Telephone Encounter (Signed)
Copied from CRM [id number]. Topic: General - Other >> Feb 28, 2023  3:51 PM Dominique A wrote: Reason for CRM: Pt has a appt for her physical on 03/12/23. Pt is wanting to see if she can get her blood work done before her physical and is wanting to see if her PCP could order for her thyroid to be checked due to her having Graves disease. Pt states that she will be out of town next week and would like to get her lab work done tomorrow or Friday so that her results can be back before her physical.  Please call pt back to advise.

## 2023-03-01 ENCOUNTER — Other Ambulatory Visit: Payer: Self-pay | Admitting: Family Medicine

## 2023-03-01 DIAGNOSIS — Z1211 Encounter for screening for malignant neoplasm of colon: Secondary | ICD-10-CM | POA: Insufficient documentation

## 2023-03-01 DIAGNOSIS — D649 Anemia, unspecified: Secondary | ICD-10-CM

## 2023-03-01 DIAGNOSIS — E0590 Thyrotoxicosis, unspecified without thyrotoxic crisis or storm: Secondary | ICD-10-CM

## 2023-03-01 DIAGNOSIS — R739 Hyperglycemia, unspecified: Secondary | ICD-10-CM

## 2023-03-01 DIAGNOSIS — E559 Vitamin D deficiency, unspecified: Secondary | ICD-10-CM

## 2023-03-01 DIAGNOSIS — E7800 Pure hypercholesterolemia, unspecified: Secondary | ICD-10-CM

## 2023-03-12 ENCOUNTER — Other Ambulatory Visit (HOSPITAL_COMMUNITY)
Admission: RE | Admit: 2023-03-12 | Discharge: 2023-03-12 | Disposition: A | Discharge status: Home / Self Care | Payer: BC Managed Care – PPO | Source: Ambulatory Visit | Attending: Family Medicine | Admitting: Family Medicine

## 2023-03-12 ENCOUNTER — Encounter: Payer: Self-pay | Admitting: Family Medicine

## 2023-03-12 ENCOUNTER — Ambulatory Visit (INDEPENDENT_AMBULATORY_CARE_PROVIDER_SITE_OTHER): Payer: BC Managed Care – PPO | Admitting: Family Medicine

## 2023-03-12 VITALS — BP 106/63 | HR 88 | Ht 66.0 in | Wt 183.0 lb

## 2023-03-12 DIAGNOSIS — Z0000 Encounter for general adult medical examination without abnormal findings: Secondary | ICD-10-CM | POA: Diagnosis not present

## 2023-03-12 DIAGNOSIS — Z124 Encounter for screening for malignant neoplasm of cervix: Secondary | ICD-10-CM | POA: Diagnosis present

## 2023-03-12 DIAGNOSIS — E7800 Pure hypercholesterolemia, unspecified: Secondary | ICD-10-CM | POA: Diagnosis not present

## 2023-03-12 DIAGNOSIS — R7303 Prediabetes: Secondary | ICD-10-CM

## 2023-03-12 DIAGNOSIS — E0590 Thyrotoxicosis, unspecified without thyrotoxic crisis or storm: Secondary | ICD-10-CM | POA: Diagnosis not present

## 2023-03-12 DIAGNOSIS — Z1211 Encounter for screening for malignant neoplasm of colon: Secondary | ICD-10-CM

## 2023-03-12 DIAGNOSIS — Z1231 Encounter for screening mammogram for malignant neoplasm of breast: Secondary | ICD-10-CM | POA: Diagnosis not present

## 2023-03-12 DIAGNOSIS — Z780 Asymptomatic menopausal state: Secondary | ICD-10-CM

## 2023-03-12 NOTE — Assessment & Plan Note (Signed)
Chronic, repeat LP recommend diet low in saturated fat and regular exercise - 30 min at least 5 times per week LDL goal <100 Not on statin at this time The 10-year ASCVD risk score (Arnett DK, et al., 2019) is: 3.2%

## 2023-03-12 NOTE — Assessment & Plan Note (Signed)
Check DEXA given height loss of 2" in 2 years

## 2023-03-12 NOTE — Progress Notes (Signed)
Complete physical exam   Patient: Rebekah [name]   DOB: 01-22-1961   62 y.o. Female  MRN: [id number] Visit Date: 03/12/2023  Today's healthcare provider: Jacky Kindle, FNP  Introduced to nurse practitioner role and practice setting.  All questions answered.  Discussed provider/patient relationship and expectations.  Chief Complaint  Patient presents with   Annual Exam   Subjective    Rebekah [name] is a 62 y.o. female who presents today for a complete physical exam.  She reports consuming a general diet. Gym/ health club routine includes high impact aerobics  and walking on track . She generally feels well. She reports sleeping well. She does have additional problems to discuss today.  HPI HPI   Pt requesting skin check due to family Hx-melanoma. Last edited by Shelly Bombard, CMA on 03/12/2023  3:12 PM.      Past Medical History:  Diagnosis Date   Anemia    Arthritis    Hyperlipidemia    Motion sickness    back seat of cars   Varicose veins    Vitamin D deficiency    Vitiligo    Wears contact lenses    Past Surgical History:  Procedure Laterality Date   COLONOSCOPY  09/19/2012   COLONOSCOPY WITH PROPOFOL N/A 10/12/2017   Procedure: COLONOSCOPY WITH PROPOFOL;  Surgeon: Midge Minium, MD;  Location: [facility];  Service: Endoscopy;  Laterality: N/A;   COLPOSCOPY  2000   Social History   Socioeconomic History   Marital status: Married    Spouse name: Normand Sloop   Number of children: 3   Years of education: 16   Highest education level: Bachelor's degree (e.g., BA, AB, BS)  Occupational History   Occupation: Administrator, arts: [employer]    Comment: [employer]  Tobacco Use   Smoking status: Never   Smokeless tobacco: Never  Vaping Use   Vaping Use: Never used  Substance and Sexual Activity   Alcohol use: Yes    Alcohol/week: 1.0 standard drink of alcohol    Types: 1 Glasses of wine per week    Comment: 1 drink 2-4 times per  month   Drug use: No   Sexual activity: Not on file  Other Topics Concern   Not on file  Social History Narrative   Not on file   Social Determinants of Health   Financial Resource Strain: Not on file  Food Insecurity: Not on file  Transportation Needs: Not on file  Physical Activity: Not on file  Stress: Not on file  Social Connections: Not on file  Intimate Partner Violence: Not on file   Family Status  Relation Name Status   Mother  Deceased   Father  Deceased   Brother  Deceased   Emelda Brothers  (Not Specified)   Neg Hx  (Not Specified)   Family History  Problem Relation Age of Onset   Cancer Father        lung cancer   Melanoma Father    Hypertension Father    Cancer Brother        colon cancer   Alcohol abuse Brother    Ovarian cancer Paternal Aunt    Breast cancer Neg Hx    No Known Allergies  Patient Care Team: Jacky Kindle, FNP as PCP - General (Family Medicine)   Medications: Outpatient Medications Prior to Visit  Medication Sig   Ruxolitinib Phosphate (OPZELURA) 1.5 % CREA Apply 1 application  topically 2 (two) times daily. Apply to aa hands, wrist, knees, feet, toes, chest bid   [DISCONTINUED] benzonatate (TESSALON) 200 MG capsule Take 1 capsule (200 mg total) by mouth 2 (two) times daily as needed for cough.   [DISCONTINUED] IBUPROFEN PO Take by mouth as needed.   [DISCONTINUED] VITAMIN C, CALCIUM ASCORBATE, PO Take by mouth.   [DISCONTINUED] VITAMIN D, ERGOCALCIFEROL, PO Take by mouth.   [DISCONTINUED] Zinc 100 MG TABS Take by mouth.   [DISCONTINUED] methimazole (TAPAZOLE) 5 MG tablet Take 5 mg by mouth 3 (three) times daily.   No facility-administered medications prior to visit.    Review of Systems  Last CBC Lab Results  Component Value Date   WBC 3.8 11/23/2020   HGB 13.3 11/23/2020   HCT 40.1 11/23/2020   MCV 80 11/23/2020   MCH 26.4 (L) 11/23/2020   RDW 13.0 11/23/2020   PLT 205 11/23/2020   Last metabolic panel Lab Results   Component Value Date   GLUCOSE 88 12/19/2021   NA 143 12/19/2021   K 4.3 12/19/2021   CL 107 (H) 12/19/2021   CO2 21 12/19/2021   BUN 15 12/19/2021   CREATININE 0.62 12/19/2021   EGFR 101 12/19/2021   CALCIUM 9.0 12/19/2021   PROT 6.6 12/19/2021   ALBUMIN 4.7 12/19/2021   LABGLOB 1.9 12/19/2021   AGRATIO 2.5 (H) 12/19/2021   BILITOT 0.7 12/19/2021   ALKPHOS 94 12/19/2021   AST 25 12/19/2021   ALT 18 12/19/2021   Last lipids Lab Results  Component Value Date   CHOL 247 (H) 12/19/2021   HDL 83 12/19/2021   LDLCALC 149 (H) 12/19/2021   TRIG 91 12/19/2021   CHOLHDL 2.7 10/09/2019   Last hemoglobin A1c Lab Results  Component Value Date   HGBA1C 5.7 (H) 11/23/2020   Last thyroid functions Lab Results  Component Value Date   TSH <0.005 (L) 01/07/2021   T4TOTAL 13.6 (H) 01/07/2021   Last vitamin D Lab Results  Component Value Date   VD25OH 51.1 11/23/2020   Last vitamin B12 and Folate Lab Results  Component Value Date   VITAMINB12 493 05/29/2016      Objective    BP 106/63 (BP Location: Right Arm, Patient Position: Sitting, Cuff Size: Large)   Pulse 88   Ht 5\' 6"  (1.676 m)   Wt 183 lb (83 kg)   SpO2 97%   BMI 29.54 kg/m  BP Readings from Last 3 Encounters:  03/12/23 106/63  12/19/21 123/89  11/22/20 117/86   Wt Readings from Last 3 Encounters:  03/12/23 183 lb (83 kg)  12/19/21 186 lb (84.4 kg)  11/22/20 160 lb (72.6 kg)   SpO2 Readings from Last 3 Encounters:  03/12/23 97%  12/19/21 98%  11/06/18 98%       Physical Exam Vitals and nursing note reviewed.  Constitutional:      General: She is awake. She is not in acute distress.    Appearance: Normal appearance. She is well-developed, well-groomed and overweight. She is not ill-appearing, toxic-appearing or diaphoretic.  HENT:     Head: Normocephalic and atraumatic.     Jaw: There is normal jaw occlusion. No trismus, tenderness, swelling or pain on movement.     Right Ear: Hearing,  tympanic membrane, ear canal and external ear normal. There is no impacted cerumen.     Left Ear: Hearing, tympanic membrane, ear canal and external ear normal. There is no impacted cerumen.     Nose: Nose normal. No congestion  or rhinorrhea.     Right Turbinates: Not enlarged, swollen or pale.     Left Turbinates: Not enlarged, swollen or pale.     Right Sinus: No maxillary sinus tenderness or frontal sinus tenderness.     Left Sinus: No maxillary sinus tenderness or frontal sinus tenderness.     Mouth/Throat:     Lips: Pink.     Mouth: Mucous membranes are moist. No injury.     Tongue: No lesions.     Pharynx: Oropharynx is clear. Uvula midline. No pharyngeal swelling, oropharyngeal exudate, posterior oropharyngeal erythema or uvula swelling.     Tonsils: No tonsillar exudate or tonsillar abscesses.  Eyes:     General: Lids are normal. Lids are everted, no foreign bodies appreciated. Vision grossly intact. Gaze aligned appropriately. No allergic shiner or visual field deficit.       Right eye: No discharge.        Left eye: No discharge.     Extraocular Movements: Extraocular movements intact.     Conjunctiva/sclera: Conjunctivae normal.     Right eye: Right conjunctiva is not injected. No exudate.    Left eye: Left conjunctiva is not injected. No exudate.    Pupils: Pupils are equal, round, and reactive to light.  Neck:     Thyroid: No thyroid mass, thyromegaly or thyroid tenderness.     Vascular: No carotid bruit.     Trachea: Trachea normal.  Cardiovascular:     Rate and Rhythm: Normal rate and regular rhythm.     Pulses: Normal pulses.          Carotid pulses are 2+ on the right side and 2+ on the left side.      Radial pulses are 2+ on the right side and 2+ on the left side.       Dorsalis pedis pulses are 2+ on the right side and 2+ on the left side.       Posterior tibial pulses are 2+ on the right side and 2+ on the left side.     Heart sounds: Normal heart sounds, S1  normal and S2 normal. No murmur heard.    No friction rub. No gallop.  Pulmonary:     Effort: Pulmonary effort is normal. No respiratory distress.     Breath sounds: Normal breath sounds and air entry. No stridor. No wheezing, rhonchi or rales.  Chest:     Chest wall: No tenderness.     Comments: Breasts: breasts appear normal, no suspicious masses, no skin or nipple changes or axillary nodes, symmetric fibrous changes in both upper outer quadrants, right breast normal without mass, skin or nipple changes or axillary nodes, left breast normal without mass, skin or nipple changes or axillary nodes, risk and benefit of breast self-exam was discussed  Abdominal:     General: Abdomen is flat. Bowel sounds are normal. There is no distension.     Palpations: Abdomen is soft. There is no mass.     Tenderness: There is no abdominal tenderness. There is no right CVA tenderness, left CVA tenderness, guarding or rebound.     Hernia: No hernia is present.  Genitourinary:    General: Normal vulva.     Exam position: Lithotomy position.     Tanner stage (genital): 5.     Vagina: Normal.     Cervix: Normal.     Uterus: Normal.      Adnexa: Right adnexa normal and left adnexa normal.  Musculoskeletal:  General: No swelling, tenderness, deformity or signs of injury. Normal range of motion.     Cervical back: Full passive range of motion without pain, normal range of motion and neck supple. No edema, rigidity or tenderness. No muscular tenderness.     Right lower leg: No edema.     Left lower leg: No edema.  Lymphadenopathy:     Cervical: No cervical adenopathy.     Right cervical: No superficial, deep or posterior cervical adenopathy.    Left cervical: No superficial, deep or posterior cervical adenopathy.  Skin:    General: Skin is warm and dry.     Capillary Refill: Capillary refill takes less than 2 seconds.     Coloration: Skin is not jaundiced or pale.     Findings: No bruising,  erythema, lesion or rash.     Comments: Ongoing vitiligo; f/b derm   Neurological:     General: No focal deficit present.     Mental Status: She is alert and oriented to person, place, and time. Mental status is at baseline.     GCS: GCS eye subscore is 4. GCS verbal subscore is 5. GCS motor subscore is 6.     Sensory: Sensation is intact. No sensory deficit.     Motor: Motor function is intact. No weakness.     Coordination: Coordination is intact. Coordination normal.     Gait: Gait is intact. Gait normal.  Psychiatric:        Attention and Perception: Attention and perception normal.        Mood and Affect: Mood and affect normal.        Speech: Speech normal.        Behavior: Behavior normal. Behavior is cooperative.        Thought Content: Thought content normal.        Cognition and Memory: Cognition and memory normal.        Judgment: Judgment normal.     Last depression screening scores    03/12/2023    3:18 PM 12/19/2021    2:16 PM 11/22/2020   10:01 AM  PHQ 2/9 Scores  PHQ - 2 Score 0 0 0  PHQ- 9 Score 0 0 2   Last fall risk screening    03/12/2023    3:18 PM  Fall Risk   Falls in the past year? 0  Number falls in past yr: 0  Injury with Fall? 0   Last Audit-C alcohol use screening    03/12/2023    3:17 PM  Alcohol Use Disorder Test (AUDIT)  1. How often do you have a drink containing alcohol? 1  2. How many drinks containing alcohol do you have on a typical day when you are drinking? 0  3. How often do you have six or more drinks on one occasion? 0  AUDIT-C Score 1   A score of 3 or more in women, and 4 or more in men indicates increased risk for alcohol abuse, EXCEPT if all of the points are from question 1   No results found for any visits on 03/12/23.  Assessment & Plan    Routine Health Maintenance and Physical Exam  Exercise Activities and Dietary recommendations  Goals   None     Immunization History  Administered Date(s) Administered    Influenza Split 09/05/2013, 08/06/2018   Influenza, Quadrivalent, Recombinant, Inj, Pf 07/31/2019   Influenza-Unspecified 07/31/2019, 07/19/2020, 08/16/2021   PFIZER Comirnaty(Gray Top)Covid-19 Tri-Sucrose Vaccine 01/06/2020, 01/27/2020   Pfizer  Covid-19 Vaccine Bivalent Booster 62yrs & up 08/16/2021   Tdap 05/13/2009, 12/03/2018   Zoster Recombinat (Shingrix) 11/22/2021    Health Maintenance  Topic Date Due   Zoster Vaccines- Shingrix (2 of 2) 01/17/2022   PAP SMEAR-Modifier  05/28/2022   COVID-19 Vaccine (4 - 2023-24 season) 06/30/2022   COLONOSCOPY (Pts 45-8yrs Insurance coverage will need to be confirmed)  10/12/2022   INFLUENZA VACCINE  05/31/2023   MAMMOGRAM  03/23/2024   DTaP/Tdap/Td (3 - Td or Tdap) 12/03/2028   Hepatitis C Screening  Completed   HIV Screening  Completed   HPV VACCINES  Aged Out    Discussed health benefits of physical activity, and encouraged her to engage in regular exercise appropriate for her age and condition.  Problem List Items Addressed This Visit       Endocrine   Hyperthyroidism    Chronic, off medication Will repeat labs per pt request Denies palpitations, weight gain or irritability         Other   Annual physical exam - Primary    UTD on dental and vision Due for PAP, mammo, dexa, colon Things to do to keep yourself healthy  - Exercise at least 30-45 minutes a day, 3-4 days a week.  - Eat a low-fat diet with lots of fruits and vegetables, up to 7-9 servings per day.  - Seatbelts can save your life. Wear them always.  - Smoke detectors on every level of your home, check batteries every year.  - Eye Doctor - have an eye exam every 1-2 years  - Safe sex - if you may be exposed to STDs, use a condom.  - Alcohol -  If you drink, do it moderately, less than 2 drinks per day.  - Health Care Power of Attorney. Choose someone to speak for you if you are not able.  - Depression is common in our stressful world.If you're feeling down or  losing interest in things you normally enjoy, please come in for a visit.  - Violence - If anyone is threatening or hurting you, please call immediately.       Relevant Orders   CBC with Differential/Platelet   Comprehensive Metabolic Panel (CMET)   TSH + free T4   Lipid panel   Hypercholesterolemia without hypertriglyceridemia    Chronic, repeat LP recommend diet low in saturated fat and regular exercise - 30 min at least 5 times per week LDL goal <100 Not on statin at this time The 10-year ASCVD risk score (Arnett DK, et al., 2019) is: 3.2%       Post-menopausal    Check DEXA given height loss of 2" in 2 years      Relevant Orders   DG Bone Density   Prediabetes    Chronic, controlled with diet/exercise Repeat A1c Continue to recommend balanced, lower carb meals. Smaller meal size, adding snacks. Choosing water as drink of choice and increasing purposeful exercise.       Relevant Orders   Lipid panel   Hemoglobin A1c   Screen for colon cancer    Normal colon with 5 year f/u; referral placed       Relevant Orders   Ambulatory referral to Gastroenterology   Screening for cervical cancer    PAP completed; no complaints      Relevant Orders   Cytology - PAP   Screening mammogram for breast cancer    Due for screening for mammogram, denies breast concerns, provided with phone number to call  and schedule appointment for mammogram. Encouraged to repeat breast cancer screening every 1-2 years.       Relevant Orders   MM 3D SCREENING MAMMOGRAM BILATERAL BREAST   Return in about 1 year (around 03/11/2024) for annual examination.    Leilani Merl, FNP, have reviewed all documentation for this visit. The documentation on 03/12/23 for the exam, diagnosis, procedures, and orders are all accurate and complete.  Jacky Kindle, FNP  [facility] [phone] (phone) [phone] (fax)  [facility]

## 2023-03-12 NOTE — Assessment & Plan Note (Signed)
PAP completed; no complaints

## 2023-03-12 NOTE — Assessment & Plan Note (Signed)
Chronic, controlled with diet/exercise Repeat A1c Continue to recommend balanced, lower carb meals. Smaller meal size, adding snacks. Choosing water as drink of choice and increasing purposeful exercise.  

## 2023-03-12 NOTE — Assessment & Plan Note (Signed)
UTD on dental and vision Due for PAP, mammo, dexa, colon Things to do to keep yourself healthy  - Exercise at least 30-45 minutes a day, 3-4 days a week.  - Eat a low-fat diet with lots of fruits and vegetables, up to 7-9 servings per day.  - Seatbelts can save your life. Wear them always.  - Smoke detectors on every level of your home, check batteries every year.  - Eye Doctor - have an eye exam every 1-2 years  - Safe sex - if you may be exposed to STDs, use a condom.  - Alcohol -  If you drink, do it moderately, less than 2 drinks per day.  - Health Care Power of Attorney. Choose someone to speak for you if you are not able.  - Depression is common in our stressful world.If you're feeling down or losing interest in things you normally enjoy, please come in for a visit.  - Violence - If anyone is threatening or hurting you, please call immediately.

## 2023-03-12 NOTE — Assessment & Plan Note (Signed)
Normal colon with 5 year f/u; referral placed

## 2023-03-12 NOTE — Patient Instructions (Signed)
Please call and schedule your mammogram and DEXA  [facility]  [address], [address] [facility] [place],  [place]  [address] Get Driving Directions Main: [phone]  Sunday:Closed Monday:7:20 AM - 5:00 PM Tuesday:7:20 AM - 5:00 PM Wednesday:7:20 AM - 5:00 PM Thursday:7:20 AM - 5:00 PM Friday:7:20 AM - 4:30 PM Saturday:Closed  

## 2023-03-12 NOTE — Assessment & Plan Note (Signed)
Chronic, off medication Will repeat labs per pt request Denies palpitations, weight gain or irritability

## 2023-03-12 NOTE — Assessment & Plan Note (Signed)
Due for screening for mammogram, denies breast concerns, provided with phone number to call and schedule appointment for mammogram. Encouraged to repeat breast cancer screening every 1-2 years.  

## 2023-03-14 ENCOUNTER — Encounter: Payer: Self-pay | Admitting: *Deleted

## 2023-03-15 LAB — CBC WITH DIFFERENTIAL/PLATELET
Basophils Absolute: 0 10*3/uL (ref 0.0–0.2)
Basos: 1 %
EOS (ABSOLUTE): 0.2 10*3/uL (ref 0.0–0.4)
Eos: 5 %
Hematocrit: 36.9 % (ref 34.0–46.6)
Hemoglobin: 12.4 g/dL (ref 11.1–15.9)
Immature Grans (Abs): 0 10*3/uL (ref 0.0–0.1)
Immature Granulocytes: 0 %
Lymphocytes Absolute: 1.4 10*3/uL (ref 0.7–3.1)
Lymphs: 30 %
MCH: 29 pg (ref 26.6–33.0)
MCHC: 33.6 g/dL (ref 31.5–35.7)
MCV: 86 fL (ref 79–97)
Monocytes Absolute: 0.6 10*3/uL (ref 0.1–0.9)
Monocytes: 13 %
Neutrophils Absolute: 2.5 10*3/uL (ref 1.4–7.0)
Neutrophils: 51 %
Platelets: 233 10*3/uL (ref 150–450)
RBC: 4.27 x10E6/uL (ref 3.77–5.28)
RDW: 12.6 % (ref 11.7–15.4)
WBC: 4.8 10*3/uL (ref 3.4–10.8)

## 2023-03-15 LAB — COMPREHENSIVE METABOLIC PANEL
ALT: 23 IU/L (ref 0–32)
AST: 24 IU/L (ref 0–40)
Albumin/Globulin Ratio: 1.9 (ref 1.2–2.2)
Albumin: 4.2 g/dL (ref 3.9–4.9)
Alkaline Phosphatase: 87 IU/L (ref 44–121)
BUN/Creatinine Ratio: 21 (ref 12–28)
BUN: 15 mg/dL (ref 8–27)
Bilirubin Total: 0.7 mg/dL (ref 0.0–1.2)
CO2: 20 mmol/L (ref 20–29)
Calcium: 9.1 mg/dL (ref 8.7–10.3)
Chloride: 105 mmol/L (ref 96–106)
Creatinine, Ser: 0.72 mg/dL (ref 0.57–1.00)
Globulin, Total: 2.2 g/dL (ref 1.5–4.5)
Glucose: 95 mg/dL (ref 70–99)
Potassium: 4.2 mmol/L (ref 3.5–5.2)
Sodium: 140 mmol/L (ref 134–144)
Total Protein: 6.4 g/dL (ref 6.0–8.5)
eGFR: 93 mL/min/{1.73_m2} (ref >59)

## 2023-03-15 LAB — LIPID PANEL
Chol/HDL Ratio: 3.8 ratio (ref 0.0–4.4)
Cholesterol, Total: 248 mg/dL — ABNORMAL HIGH (ref 100–199)
HDL: 65 mg/dL (ref >39)
LDL Chol Calc (NIH): 167 mg/dL — ABNORMAL HIGH (ref 0–99)
Triglycerides: 95 mg/dL (ref 0–149)
VLDL Cholesterol Cal: 16 mg/dL (ref 5–40)

## 2023-03-15 LAB — HEMOGLOBIN A1C
Est. average glucose Bld gHb Est-mCnc: 114 mg/dL
Hgb A1c MFr Bld: 5.6 % (ref 4.8–5.6)

## 2023-03-15 LAB — TSH+FREE T4
Free T4: 1.35 ng/dL (ref 0.82–1.77)
TSH: 1.56 u[IU]/mL (ref 0.450–4.500)

## 2023-03-15 NOTE — Progress Notes (Signed)
Cholesterol remains unchanged- continued elevated total and LDL. The 10-year ASCVD risk score (Arnett DK, et al., 2019) is: 3.7%  All other labs are normal/stable.  Jacky Kindle, FNP  [facility] [address] [place], [place] [address] [phone] (phone) [phone] (fax) [facility]

## 2023-03-19 LAB — CYTOLOGY - PAP
Chlamydia: NEGATIVE
Comment: NEGATIVE
Comment: NEGATIVE
Comment: NEGATIVE
Comment: NEGATIVE
Comment: NORMAL
Diagnosis: NEGATIVE
HSV1: NEGATIVE
HSV2: NEGATIVE
High risk HPV: NEGATIVE
Neisseria Gonorrhea: NEGATIVE
Trichomonas: NEGATIVE

## 2023-03-20 NOTE — Progress Notes (Signed)
Normal, negative PAP. No concerns for cellular changes or HPV or STIs. PAP smears are no longer needed unless pelvic/vaginal concerns present.

## 2023-04-26 ENCOUNTER — Other Ambulatory Visit: Payer: BC Managed Care – PPO

## 2023-06-25 ENCOUNTER — Ambulatory Visit
Admission: RE | Admit: 2023-06-25 | Discharge: 2023-06-25 | Disposition: A | Discharge status: Home / Self Care | Payer: BC Managed Care – PPO | Source: Ambulatory Visit | Attending: Family Medicine | Admitting: Family Medicine

## 2023-06-25 DIAGNOSIS — Z1231 Encounter for screening mammogram for malignant neoplasm of breast: Secondary | ICD-10-CM | POA: Diagnosis present

## 2023-06-25 DIAGNOSIS — Z780 Asymptomatic menopausal state: Secondary | ICD-10-CM

## 2023-07-26 ENCOUNTER — Ambulatory Visit: Payer: BC Managed Care – PPO | Admitting: Dermatology

## 2023-09-19 ENCOUNTER — Ambulatory Visit: Payer: BC Managed Care – PPO | Admitting: Dermatology

## 2023-09-19 DIAGNOSIS — L578 Other skin changes due to chronic exposure to nonionizing radiation: Secondary | ICD-10-CM | POA: Diagnosis not present

## 2023-09-19 DIAGNOSIS — L80 Vitiligo: Secondary | ICD-10-CM | POA: Diagnosis not present

## 2023-09-19 DIAGNOSIS — L814 Other melanin hyperpigmentation: Secondary | ICD-10-CM

## 2023-09-19 DIAGNOSIS — D1801 Hemangioma of skin and subcutaneous tissue: Secondary | ICD-10-CM

## 2023-09-19 DIAGNOSIS — D492 Neoplasm of unspecified behavior of bone, soft tissue, and skin: Secondary | ICD-10-CM | POA: Diagnosis not present

## 2023-09-19 DIAGNOSIS — D489 Neoplasm of uncertain behavior, unspecified: Secondary | ICD-10-CM

## 2023-09-19 DIAGNOSIS — W908XXA Exposure to other nonionizing radiation, initial encounter: Secondary | ICD-10-CM

## 2023-09-19 DIAGNOSIS — Z808 Family history of malignant neoplasm of other organs or systems: Secondary | ICD-10-CM

## 2023-09-19 DIAGNOSIS — Z1283 Encounter for screening for malignant neoplasm of skin: Secondary | ICD-10-CM | POA: Diagnosis not present

## 2023-09-19 DIAGNOSIS — S00411A Abrasion of right ear, initial encounter: Secondary | ICD-10-CM

## 2023-09-19 DIAGNOSIS — D229 Melanocytic nevi, unspecified: Secondary | ICD-10-CM

## 2023-09-19 DIAGNOSIS — I8393 Asymptomatic varicose veins of bilateral lower extremities: Secondary | ICD-10-CM

## 2023-09-19 DIAGNOSIS — D239 Other benign neoplasm of skin, unspecified: Secondary | ICD-10-CM

## 2023-09-19 DIAGNOSIS — L820 Inflamed seborrheic keratosis: Secondary | ICD-10-CM

## 2023-09-19 DIAGNOSIS — L821 Other seborrheic keratosis: Secondary | ICD-10-CM

## 2023-09-19 DIAGNOSIS — D2371 Other benign neoplasm of skin of right lower limb, including hip: Secondary | ICD-10-CM

## 2023-09-19 DIAGNOSIS — D2271 Melanocytic nevi of right lower limb, including hip: Secondary | ICD-10-CM

## 2023-09-19 DIAGNOSIS — I781 Nevus, non-neoplastic: Secondary | ICD-10-CM

## 2023-09-19 MED ORDER — OPZELURA 1.5 % EX CREA: 1.0000 | TOPICAL_CREAM | Freq: Two times a day (BID) | CUTANEOUS | 11 refills | Status: AC

## 2023-09-19 NOTE — Patient Instructions (Addendum)
A digital mucous cyst also known as a myxoid cyst or pseudocyst is a ganglion cyst arising from the distal interphalangeal (DIP) joint of the finger or thumb (or, less commonly, toe). The cysts are believed to form from degeneration of connective tissue and are associated with osteoarthritic joints or injury. Although the exact etiology is unknown, it is likely that a small tear forms in a joint capsule or tendon sheath, allowing extravasation of synovial fluid into the adjacent tissue. When the fluid reacts with local tissue, it becomes more gelatinous and a cyst wall forms. With any treatment, there is a high rate of recurrence.   Treatment options include: - Puncture / Incision & Drainage (I&D) - Intralesional steroid injection - Intralesional Sclerosant injection (Asclera/ Polidocanol) - Intralesional steroid + sclerosant - Corticosteroid tape - Cryosurgery - Laser (CO2) - Infrared photocoagulation - Excision / Surgery    Biopsy Wound Care Instructions  Leave the original bandage on for 24 hours if possible.  If the bandage becomes soaked or soiled before that time, it is OK to remove it and examine the wound.  A small amount of post-operative bleeding is normal.  If excessive bleeding occurs, remove the bandage, place gauze over the site and apply continuous pressure (no peeking) over the area for 30 minutes. If this does not work, please call our clinic as soon as possible or page your doctor if it is after hours.   Once a day, cleanse the wound with soap and water. It is fine to shower. If a thick crust develops you may use a Q-tip dipped into dilute hydrogen peroxide (mix 1:1 with water) to dissolve it.  Hydrogen peroxide can slow the healing process, so use it only as needed.    After washing, apply petroleum jelly (Vaseline) or an antibiotic ointment if your doctor prescribed one for you, followed by a bandage.    For best healing, the wound should be covered with a layer of ointment  at all times. If you are not able to keep the area covered with a bandage to hold the ointment in place, this may mean re-applying the ointment several times a day.  Continue this wound care until the wound has healed and is no longer open.   Itching and mild discomfort is normal during the healing process. However, if you develop pain or severe itching, please call our office.   If you have any discomfort, you can take Tylenol (acetaminophen) or ibuprofen as directed on the bottle. (Please do not take these if you have an allergy to them or cannot take them for another reason).  Some redness, tenderness and white or yellow material in the wound is normal healing.  If the area becomes very sore and red, or develops a thick yellow-green material (pus), it may be infected; please notify us.    If you have stitches, return to clinic as directed to have the stitches removed. You will continue wound care for 2-3 days after the stitches are removed.   Wound healing continues for up to one year following surgery. It is not unusual to experience pain in the scar from time to time during the interval.  If the pain becomes severe or the scar thickens, you should notify the office.    A slight amount of redness in a scar is expected for the first six months.  After six months, the redness will fade and the scar will soften and fade.  The color difference becomes less noticeable with  time.  If there are any problems, return for a post-op surgery check at your earliest convenience.  To improve the appearance of the scar, you can use silicone scar gel, cream, or sheets (such as Mederma or Serica) every night for up to one year. These are available over the counter (without a prescription).  Please call our office at [phone] for any questions or concerns.       For rough and bumpy skin Recommend starting moisturizer with exfoliant (Urea, Salicylic acid, or Lactic acid) one to two times daily to help  smooth rough and bumpy skin.  OTC options include Cetaphil Rough and Bumpy lotion (Urea), Eucerin Roughness Relief lotion or spot treatment cream (Urea), CeraVe SA lotion/cream for Rough and Bumpy skin (Sal Acid), Gold Bond Rough and Bumpy cream (Sal Acid), and AmLactin 12% lotion/cream (Lactic Acid).  If applying in morning, also apply sunscreen to sun-exposed areas, since these exfoliating moisturizers can increase sensitivity to sun.     Melanoma ABCDEs  Melanoma is the most dangerous type of skin cancer, and is the leading cause of death from skin disease.  You are more likely to develop melanoma if you: Have light-colored skin, light-colored eyes, or red or blond hair Spend a lot of time in the sun Tan regularly, either outdoors or in a tanning bed Have had blistering sunburns, especially during childhood Have a close family member who has had a melanoma Have atypical moles or large birthmarks  Early detection of melanoma is key since treatment is typically straightforward and cure rates are extremely high if we catch it early.   The first sign of melanoma is often a change in a mole or a new dark spot.  The ABCDE system is a way of remembering the signs of melanoma.  A for asymmetry:  The two halves do not match. B for border:  The edges of the growth are irregular. C for color:  A mixture of colors are present instead of an even brown color. D for diameter:  Melanomas are usually (but not always) greater than 6mm - the size of a pencil eraser. E for evolution:  The spot keeps changing in size, shape, and color.  Please check your skin once per month between visits. You can use a small mirror in front and a large mirror behind you to keep an eye on the back side or your body.   If you see any new or changing lesions before your next follow-up, please call to schedule a visit.  Please continue daily skin protection including broad spectrum sunscreen SPF 30+ to sun-exposed areas,  reapplying every 2 hours as needed when you're outdoors.   Staying in the shade or wearing long sleeves, sun glasses (UVA+UVB protection) and wide brim hats (4-inch brim around the entire circumference of the hat) are also recommended for sun protection.     Due to recent changes in healthcare laws, you may see results of your pathology and/or laboratory studies on MyChart before the doctors have had a chance to review them. We understand that in some cases there may be results that are confusing or concerning to you. Please understand that not all results are received at the same time and often the doctors may need to interpret multiple results in order to provide you with the best plan of care or course of treatment. Therefore, we ask that you please give [place] 2 business days to thoroughly review all your results before contacting the office for clarification.  Should we see a critical lab result, you will be contacted sooner.   If You Need Anything After Your Visit  If you have any questions or concerns for your doctor, please call our main line at [phone] and press option 4 to reach your doctor's medical assistant. If no one answers, please leave a voicemail as directed and we will return your call as soon as possible. Messages left after 4 pm will be answered the following business day.   You may also send [place] a message via MyChart. We typically respond to MyChart messages within 1-2 business days.  For prescription refills, please ask your pharmacy to contact our office. Our fax number is [phone].  If you have an urgent issue when the clinic is closed that cannot wait until the next business day, you can page your doctor at the number below.    Please note that while we do our best to be available for urgent issues outside of office hours, we are not available 24/7.   If you have an urgent issue and are unable to reach [place], you may choose to seek medical care at your doctor's office,  retail clinic, urgent care center, or emergency room.  If you have a medical emergency, please immediately call 911 or go to the emergency department.  Pager Numbers  - Dr. Gwen Pounds: [phone]  - Dr. Roseanne Reno: [phone]  - Dr. Katrinka Blazing: [phone]   In the event of inclement weather, please call our main line at [phone] for an update on the status of any delays or closures.  Dermatology Medication Tips: Please keep the boxes that topical medications come in in order to help keep track of the instructions about where and how to use these. Pharmacies typically print the medication instructions only on the boxes and not directly on the medication tubes.   If your medication is too expensive, please contact our office at [phone] option 4 or send [place] a message through MyChart.   We are unable to tell what your co-pay for medications will be in advance as this is different depending on your insurance coverage. However, we may be able to find a substitute medication at lower cost or fill out paperwork to get insurance to cover a needed medication.   If a prior authorization is required to get your medication covered by your insurance company, please allow [place] 1-2 business days to complete this process.  Drug prices often vary depending on where the prescription is filled and some pharmacies may offer cheaper prices.  The website www.goodrx.com contains coupons for medications through different pharmacies. The prices here do not account for what the cost may be with help from insurance (it may be cheaper with your insurance), but the website can give you the price if you did not use any insurance.  - You can print the associated coupon and take it with your prescription to the pharmacy.  - You may also stop by our office during regular business hours and pick up a GoodRx coupon card.  - If you need your prescription sent electronically to a different pharmacy, notify our office  through [facility] or by phone at [phone] option 4.     Si Usted Necesita Algo Despus de Su Visita  Tambin puede enviarnos un mensaje a travs de Clinical cytogeneticist. Por lo general respondemos a los mensajes de MyChart en el transcurso de 1 a 2 das hbiles.  Para renovar recetas, por favor pida a su farmacia que se  ponga en contacto con nuestra oficina. Annie Sable de fax es [place] [phone].  Si tiene un asunto urgente cuando la clnica est cerrada y que no puede esperar hasta el siguiente da hbil, puede llamar/localizar a su doctor(a) al nmero que aparece a continuacin.   Por favor, tenga en cuenta que aunque hacemos todo lo posible para estar disponibles para asuntos urgentes fuera del horario de [place], no estamos disponibles las 24 horas del da, los 7 [address]  [address] de la [place].   Si tiene un problema urgente y no puede comunicarse con nosotros, puede optar por buscar atencin mdica  en el consultorio de su doctor(a), en una clnica privada, en un centro de atencin urgente o en una sala de emergencias.  Si tiene Engineer, drilling, por favor llame inmediatamente al 911 o vaya a la sala de emergencias.  Nmeros de bper  - Dr. Gwen Pounds: [phone]  - Dra. Roseanne Reno: [phone]  - Dr. Katrinka Blazing: [phone]   En caso de inclemencias del tiempo, por favor llame a Lacy Duverney principal al [phone] para una actualizacin sobre el [place] de cualquier retraso o cierre.  Consejos para la medicacin en dermatologa: Por favor, guarde las cajas en las que vienen los medicamentos de uso tpico para ayudarle a seguir las instrucciones sobre dnde y cmo usarlos. Las farmacias generalmente imprimen las instrucciones del medicamento slo en las cajas y no directamente en los tubos del [place].   Si su medicamento es muy caro, por favor, pngase en contacto con Rolm Gala llamando al [phone] y presione la opcin 4 o envenos un mensaje a travs de Clinical cytogeneticist.   No  podemos decirle cul ser su copago por los medicamentos por adelantado ya que esto es diferente dependiendo de la cobertura de su seguro. Sin embargo, es posible que podamos encontrar un medicamento sustituto a Audiological scientist un formulario para que el seguro cubra el medicamento que se considera necesario.   Si se requiere una autorizacin previa para que su compaa de seguros [place] su medicamento, por favor permtanos de 1 a 2 das hbiles para completar [address]  Los precios de los medicamentos varan con frecuencia dependiendo del Environmental consultant de dnde se surte la receta y alguna farmacias pueden ofrecer precios ms baratos.  El sitio web www.goodrx.com tiene cupones para medicamentos de Health and safety inspector. Los precios aqu no tienen en cuenta lo que podra costar con la ayuda del seguro (puede ser ms barato con su seguro), pero el sitio web puede darle el precio si no utiliz Tourist information centre manager.  - Puede imprimir el cupn correspondiente y llevarlo con su receta a la farmacia.  - Tambin puede pasar por nuestra oficina durante el horario de atencin regular y Education officer, museum una tarjeta de cupones de GoodRx.  - Si necesita que su receta se enve electrnicamente a una farmacia diferente, informe a nuestra oficina a travs de MyChart de [place] o por telfono llamando al [phone] y presione la opcin 4.

## 2023-09-19 NOTE — Progress Notes (Signed)
Follow-Up Visit   Subjective  Rebekah [name] is a 62 y.o. female who presents for the following: Skin Cancer Screening and Full Body Skin Exam Hx of vitiligo using opzelura to affected areas, patient feels has improved. Pt has h/o Grave's Thyroid Disease  Patient also reports spot at right knee, resolving spot at left side of nose, scab on right ear, dark area of right thigh, and bump at 3rd finger that sometimes is painful present for 6 months.  Drain clear thick fluid.  The patient presents for Total-Body Skin Exam (TBSE) for skin cancer screening and mole check. The patient has spots, moles and lesions to be evaluated, some may be new or changing and the patient may have concern these could be cancer.    The following portions of the chart were reviewed this encounter and updated as appropriate: medications, allergies, medical history  Review of Systems:  No other skin or systemic complaints except as noted in HPI or Assessment and Plan.  Objective  Well appearing patient in no apparent distress; mood and affect are within normal limits.  A full examination was performed including scalp, head, eyes, ears, nose, lips, neck, chest, axillae, abdomen, back, buttocks, bilateral upper extremities, bilateral lower extremities, hands, feet, fingers, toes, fingernails, and toenails. All findings within normal limits unless otherwise noted below.   Relevant physical exam findings are noted in the Assessment and Plan.  right 3rd DIP at proximal nail fold 0.6 mm firm pink white nodule            Assessment & Plan   SKIN CANCER SCREENING PERFORMED TODAY.  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  - Staying in the shade or wearing long sleeves, sun glasses  (UVA+UVB protection) and wide brim hats (4-inch brim around the entire circumference of the hat) are also recommended for sun protection.  - Call for new or changing lesions.  LENTIGINES, SEBORRHEIC KERATOSES, HEMANGIOMAS - Benign normal skin lesions - Benign-appearing - Call for any changes  Sk at left cheek   MELANOCYTIC NEVI - Tan-brown and/or pink-flesh-colored symmetric macules and papules - 2 mm medium dark brown on right medial thigh - Benign appearing on exam today - Observation - Call clinic for new or changing moles - Recommend daily use of broad spectrum spf 30+ sunscreen to sun-exposed areas.    Varicose Veins/Spider Veins - Dilated blue, purple or red veins at the lower extremities - Reassured - Smaller vessels can be treated by sclerotherapy (a procedure to inject a medicine into the veins to make them disappear) if desired, but the treatment is not covered by insurance. Larger vessels may be covered if symptomatic and we would refer to vascular surgeon if treatment desired.   DERMATOFIBROMA At right medial knee Exam: Firm pink/brown papulenodule with dimple sign.  Treatment Plan: A dermatofibroma is a benign growth possibly related to trauma, such as an insect bite, cut from shaving, or inflamed acne-type bump.  Treatment options to remove include shave or excision with resulting scar and risk of recurrence.  Since benign-appearing and not bothersome, will observe for now.   FAMILY HISTORY OF SKIN CANCER What type(s): melanoma  Who affected: father    PROBABLE ISKS with EXCORIATION (healing) at R ear- resolving Exam: Excoriation at right antihelix, faint pink scaling macule left paranasal also  right upper sternal-  all areas recent ~1-2 wks and seem to be improving per pt  Treatment Plan: Benign-appearing.  Observation.  Call clinic for new or changing lesions.  Recommend daily use of broad spectrum spf 30+ sunscreen to sun-exposed areas.    1% HC cream every  day/bid prn itch Recommend vaseline every day to ear. Call if not resolving. Will consider treating with Ln2 if doesn't clear   Vitiligo chest, arms, wrist, knees, feet, hands   Exam: depigmented patches at b/l flexor wrist, axilla, left flexor arm, right chest, BL foot dorsa, photos compares and improvement noted from baseline  Chronic and persistent condition with duration or expected duration over one year. Condition is improving with treatment but not currently at goal.    Reviewed chronic nature, no cure and can be difficult to treat.  Vitiligo is an autoimmune condition which causes loss of skin pigment and is commonly seen on the face and may also involve areas of trauma like hands, elbows, knees, and ankles.  Treatments include topical steroids and other topical anti-inflammatory ointments/creams and topical and oral Jak inhibitors.  Sometimes narrow band UV light therapy or Xtrac laser is helpful, both of which require twice weekly treatments for at least 3-6 months.  Antioxidant vitamins, such as Vitamins A,C,E,D, Folic Acid and B12 may be added to enhance treatment.   Cont Opzelura cr qd/bid aa vitiligo Cont Vitamins A,C,E,C, folic acid and B12  Vitiligo  Related Medications Ruxolitinib Phosphate (OPZELURA) 1.5 % CREA Apply 1 application  topically 2 (two) times daily. Apply to aa hands, wrist, knees, feet, toes, chest bid  Neoplasm of uncertain behavior right 3rd DIP at proximal nail fold  Epidermal / dermal shaving  Lesion diameter (cm):  0.6 Informed consent: discussed and consent obtained   Patient was prepped and draped in usual sterile fashion: Area prepped with alcohol. Anesthesia: the lesion was anesthetized in a standard fashion   Anesthetic:  1% lidocaine w/ epinephrine 1-100,000 buffered w/ 8.4% NaHCO3 Instrument used: flexible razor blade   Hemostasis achieved with: pressure, aluminum chloride and electrodesiccation   Outcome: patient tolerated procedure  well   Post-procedure details: wound care instructions given   Post-procedure details comment:  Ointment and small bandage applied.   Specimen 1 - Surgical pathology Differential Diagnosis: R/o digital mucous cyst vs other  Check Margins: No  Symptomatic, irritating, patient would like treated.   R/o digital mucous cyst vs other  A digital mucous cyst also known as a myxoid cyst or pseudocyst is a ganglion cyst arising from the distal interphalangeal (DIP) joint of the finger or thumb (or, less commonly, toe). The cysts are believed to form from degeneration of connective tissue and are associated with osteoarthritic joints or injury. Although the exact etiology is unknown, it is likely that a small tear forms in a joint capsule or tendon sheath, allowing extravasation of synovial fluid into the adjacent tissue. When the fluid reacts with local tissue, it becomes more gelatinous and a cyst wall forms. With any treatment, there is a high rate of recurrence.   Treatment options include: - Puncture / Incision & Drainage (I&D) - Intralesional steroid injection - Intralesional Sclerosant injection (Asclera/ Polidocanol) - Intralesional steroid + sclerosant - Corticosteroid tape - Cryosurgery - Laser (CO2) - Infrared photocoagulation - Excision / Surgery    Return in about 1 year (around 09/18/2024) for vitiligo / tbse with Dr. Layla Barter, CMA, am acting as scribe for Willeen Niece, MD.  Documentation: I have reviewed the above documentation for accuracy and completeness, and I agree with the above.  Willeen Niece, MD

## 2023-09-24 LAB — SURGICAL PATHOLOGY

## 2023-09-25 ENCOUNTER — Telehealth: Payer: Self-pay

## 2023-09-25 NOTE — Telephone Encounter (Signed)
Left pt msg to call for bx results/sh

## 2023-09-25 NOTE — Telephone Encounter (Signed)
-----   Message from Willeen Niece sent at 09/24/2023  8:29 PM EST ----- Skin , right 3rd DIP at proximal nail fold SPONGIOTIC VESICULAR DERMATITIS, ULCERATED, SEE DESCRIPTION  Biopsy shows dermatitis, possible bug bite reaction, did not see mucous cyst.  If not improved once healed from shave removal, would send topical clobetasol ointment bid to apply until cleared  - please call patient

## 2023-10-01 NOTE — Telephone Encounter (Signed)
Advised pt of bx results.  Advised pt once bx healed if not improved to let us know and we can send in Clobetasol ointment./sh

## 2023-11-07 ENCOUNTER — Ambulatory Visit: Payer: BC Managed Care – PPO | Admitting: Dermatology

## 2023-12-03 ENCOUNTER — Ambulatory Visit: Payer: 59 | Admitting: Family Medicine

## 2023-12-03 VITALS — BP 116/67 | HR 84 | Resp 18 | Ht 66.0 in | Wt 178.1 lb

## 2023-12-03 DIAGNOSIS — R002 Palpitations: Secondary | ICD-10-CM | POA: Diagnosis not present

## 2023-12-03 DIAGNOSIS — E0500 Thyrotoxicosis with diffuse goiter without thyrotoxic crisis or storm: Secondary | ICD-10-CM

## 2023-12-03 NOTE — Progress Notes (Signed)
Established patient visit   Patient: Rebekah [name]   DOB: 1961-02-12   63 y.o. Female  MRN: [id number] Visit Date: 12/03/2023  Today's healthcare provider: Ronnald Ramp, MD   Chief Complaint  Patient presents with   Prediabetes   Hyperlipidemia   Tachycardia    With some SOB and occasional dizziness-- possibly related to Graves disease and saw Endocrinology this morning in relation to this concern   Subjective     HPI     Tachycardia    Additional comments: With some SOB and occasional dizziness-- possibly related to Graves disease and saw Endocrinology this morning in relation to this concern      Last edited by Ashok Cordia, CMA on 12/03/2023  4:06 PM.       Discussed the use of AI scribe software for clinical note transcription with the patient, who gave verbal consent to proceed.  History of Present Illness   The patient is a 63 year old female with hyperthyroidism who presents for transfer of care.  She has a history of hyperthyroidism, specifically Graves' disease, which was previously managed with metoprolol for over a year until her condition stabilized. Recently, she has been experiencing symptoms reminiscent of her pre-treatment state, including palpitations and dyspnea, particularly with exertion. These episodes have been occurring intermittently for the past couple of months, starting around Thanksgiving to Christmas, lasting 30 minutes to an hour, and occurring one to two times a week. She last experienced these symptoms about a week ago.  She recently consulted her endocrinologist, who ordered blood work, including TPO antibodies, T3, T4, and TSH, to investigate the cause of her symptoms. The blood work was completed earlier today.  She mentions a recent cruise where she consumed more alcohol than usual, having four to five drinks a day, which she wonders might have triggered her symptoms. She typically does not drink much alcohol.  Additionally, she uses a topical medication, Selora, for her skin condition, applying it twice every three days, and questions if it could be contributing to her symptoms.  In terms of social history, she is retired but remains active by caring for her grandchildren two days a week and engaging in activities such as walking dogs and [place]. She recently returned from a cruise with her husband.  No chest pain, fever, chills, or cough. She reports feeling good most of the time and maintains an active lifestyle.         Past Medical History:  Diagnosis Date   Anemia    Arthritis    Hyperlipidemia    Motion sickness    back seat of cars   Varicose veins    Vitamin D deficiency    Vitiligo    Wears contact lenses     Medications: Outpatient Medications Prior to Visit  Medication Sig   Ascorbic Acid (VITAMIN C) 1000 MG tablet Take 1,000 mg by mouth daily.   Calcium Carbonate (CALCIUM 500 PO) Take by mouth.   Ruxolitinib Phosphate (OPZELURA) 1.5 % CREA Apply 1 application  topically 2 (two) times daily. Apply to aa hands, wrist, knees, feet, toes, chest bid   VITAMIN D, CHOLECALCIFEROL, PO Take by mouth.   Zinc Sulfate (ZINC 15 PO) Take by mouth.   No facility-administered medications prior to visit.    Review of Systems  Last CBC Lab Results  Component Value Date   WBC 4.8 03/14/2023   HGB 12.4 03/14/2023   HCT  36.9 03/14/2023   MCV 86 03/14/2023   MCH 29.0 03/14/2023   RDW 12.6 03/14/2023   PLT 233 03/14/2023   Last metabolic panel Lab Results  Component Value Date   GLUCOSE 95 03/14/2023   NA 140 03/14/2023   K 4.2 03/14/2023   CL 105 03/14/2023   CO2 20 03/14/2023   BUN 15 03/14/2023   CREATININE 0.72 03/14/2023   EGFR 93 03/14/2023   CALCIUM 9.1 03/14/2023   PROT 6.4 03/14/2023   ALBUMIN 4.2 03/14/2023   LABGLOB 2.2 03/14/2023   AGRATIO 1.9 03/14/2023   BILITOT 0.7 03/14/2023   ALKPHOS 87 03/14/2023   AST 24 03/14/2023   ALT  23 03/14/2023   Last hemoglobin A1c Lab Results  Component Value Date   HGBA1C 5.6 03/14/2023   Last thyroid functions Lab Results  Component Value Date   TSH 1.560 03/14/2023   T4TOTAL 13.6 (H) 01/07/2021        Objective    BP 116/67   Pulse 84   Resp 18   Ht 5\' 6"  (1.676 m)   Wt 178 lb 1.6 oz (80.8 kg)   SpO2 97%   BMI 28.75 kg/m   BP Readings from Last 3 Encounters:  12/03/23 116/67  03/12/23 106/63  12/19/21 123/89   Wt Readings from Last 3 Encounters:  12/03/23 178 lb 1.6 oz (80.8 kg)  03/12/23 183 lb (83 kg)  12/19/21 186 lb (84.4 kg)        Physical Exam  General: Alert, no acute distress Cardio: Normal S1 and S2, RRR, no r/m/g Pulm: CTAB, normal work of breathing   No results found for any visits on 12/03/23.  Assessment & Plan     Problem List Items Addressed This Visit       Endocrine   Graves disease   Chronic  Graves' disease, previously treated with metoprolol for 14 months. Symptoms of hyperthyroidism may be recurring, contributing to palpitations and tachycardia. Blood work ordered by endocrinologist to assess current thyroid function. Re-initiation of treatment may be necessary if thyroid function tests indicate hyperthyroidism. - Review thyroid function test results when available - Consider re-initiation of treatment if thyroid function tests indicate hyperthyroidism -continue to follow up with endocrinology as scheduled         Other   Palpitations - Primary   Intermittent palpitations and episodes of tachycardia over the past few months, possibly related to hyperthyroidism or other underlying conditions. Symptoms include heart racing and occasional dyspnea, particularly nocturnal. Differential diagnosis includes hyperthyroidism, atrial fibrillation, and holiday heart syndrome. Recent increase in alcohol consumption and use of Selora cream were discussed as potential triggers. - Await results of thyroid function tests (TPO  antibodies, T3, T4, TSH) - Consider heart monitor for 1-2 weeks if thyroid function tests are normal - Schedule follow-up appointment for late February or early March to review thyroid function test results and discuss further evaluation if necessary           General Health Maintenance Transitioning to Medicare in May and will require a Welcome to Medicare visit. Regular physical exams are scheduled annually in May. - Schedule Welcome to Medicare visit in May - Schedule annual physical exam in May or shortly thereafter      Return in about 29 days (around 01/01/2024) for dizzy, palpitations.         Ronnald Ramp, MD  [facility] [phone] (phone) [phone] (fax)  [facility]

## 2023-12-03 NOTE — Assessment & Plan Note (Signed)
Intermittent palpitations and episodes of tachycardia over the past few months, possibly related to hyperthyroidism or other underlying conditions. Symptoms include heart racing and occasional dyspnea, particularly nocturnal. Differential diagnosis includes hyperthyroidism, atrial fibrillation, and holiday heart syndrome. Recent increase in alcohol consumption and use of Selora cream were discussed as potential triggers. - Await results of thyroid function tests (TPO antibodies, T3, T4, TSH) - Consider heart monitor for 1-2 weeks if thyroid function tests are normal - Schedule follow-up appointment for late February or early March to review thyroid function test results and discuss further evaluation if necessary

## 2023-12-03 NOTE — Assessment & Plan Note (Signed)
Chronic  Graves' disease, previously treated with metoprolol for 14 months. Symptoms of hyperthyroidism may be recurring, contributing to palpitations and tachycardia. Blood work ordered by endocrinologist to assess current thyroid function. Re-initiation of treatment may be necessary if thyroid function tests indicate hyperthyroidism. - Review thyroid function test results when available - Consider re-initiation of treatment if thyroid function tests indicate hyperthyroidism -continue to follow up with endocrinology as scheduled

## 2024-01-03 ENCOUNTER — Ambulatory Visit: Payer: 59 | Admitting: Family Medicine

## 2024-03-11 DIAGNOSIS — E0590 Thyrotoxicosis, unspecified without thyrotoxic crisis or storm: Secondary | ICD-10-CM | POA: Diagnosis not present

## 2024-03-13 ENCOUNTER — Other Ambulatory Visit: Payer: Self-pay | Admitting: Nurse Practitioner

## 2024-03-13 DIAGNOSIS — E0590 Thyrotoxicosis, unspecified without thyrotoxic crisis or storm: Secondary | ICD-10-CM | POA: Diagnosis not present

## 2024-03-13 DIAGNOSIS — E0500 Thyrotoxicosis with diffuse goiter without thyrotoxic crisis or storm: Secondary | ICD-10-CM | POA: Diagnosis not present

## 2024-03-31 DIAGNOSIS — H43813 Vitreous degeneration, bilateral: Secondary | ICD-10-CM | POA: Diagnosis not present

## 2024-03-31 DIAGNOSIS — H25813 Combined forms of age-related cataract, bilateral: Secondary | ICD-10-CM | POA: Diagnosis not present

## 2024-04-08 ENCOUNTER — Ambulatory Visit
Admission: RE | Admit: 2024-04-08 | Discharge: 2024-04-08 | Disposition: A | Discharge status: Home / Self Care | Source: Ambulatory Visit | Attending: Nurse Practitioner

## 2024-04-08 DIAGNOSIS — E0590 Thyrotoxicosis, unspecified without thyrotoxic crisis or storm: Secondary | ICD-10-CM

## 2024-04-23 DIAGNOSIS — H2511 Age-related nuclear cataract, right eye: Secondary | ICD-10-CM | POA: Diagnosis not present

## 2024-04-23 DIAGNOSIS — H52221 Regular astigmatism, right eye: Secondary | ICD-10-CM | POA: Diagnosis not present

## 2024-04-23 HISTORY — PX: CATARACT EXTRACTION: SUR2

## 2024-04-24 DIAGNOSIS — E0590 Thyrotoxicosis, unspecified without thyrotoxic crisis or storm: Secondary | ICD-10-CM | POA: Diagnosis not present

## 2024-04-29 ENCOUNTER — Encounter: Payer: Self-pay | Admitting: Family Medicine

## 2024-04-29 ENCOUNTER — Ambulatory Visit: Attending: Family Medicine

## 2024-04-29 ENCOUNTER — Ambulatory Visit (INDEPENDENT_AMBULATORY_CARE_PROVIDER_SITE_OTHER): Admitting: Family Medicine

## 2024-04-29 VITALS — BP 125/82 | HR 71 | Ht 68.0 in | Wt 180.4 lb

## 2024-04-29 DIAGNOSIS — I499 Cardiac arrhythmia, unspecified: Secondary | ICD-10-CM

## 2024-04-29 DIAGNOSIS — M7052 Other bursitis of knee, left knee: Secondary | ICD-10-CM

## 2024-04-29 DIAGNOSIS — R002 Palpitations: Secondary | ICD-10-CM

## 2024-04-29 NOTE — Progress Notes (Signed)
 Acute visit   Patient: Rebekah [name]   DOB: 09-Nov-1960   63 y.o. Female  MRN: [id number] PCP: Sharma Coyer, MD   Chief Complaint  Patient presents with   Irregular Heart Beat    Patient is present due to irregular heart beat and would like to have a referral placed to cardiology.    Subjective    Discussed the use of AI scribe software for clinical note transcription with the patient, who gave verbal consent to proceed.  History of Present Illness   Rebekah [name] is a 63 year old female with hyperthyroidism who presents with heart palpitations noted during cataract surgery recovery.  She experiences heart palpitations during cataract surgery recovery, with an EKG showing PVCs. She has hyperthyroidism, previously treated with methimazole, now reduced to 2.5 mg due to stable thyroid  levels. No recent weight loss, but similar symptoms occurred around Christmas with a low TSH. Her thyroid  levels were stable as of last Thursday.  She reports increased knee pain, particularly at night, affecting her sleep. The pain is located on the inside of her left knee, sometimes radiating upwards, with stiffness after sitting. There is no swelling. She remains active, participating in boot camp, mowing, and caring for her grandchildren.       Review of Systems  Objective    BP 125/82 (BP Location: Left Arm, Patient Position: Sitting, Cuff Size: Large)   Pulse 71   Ht 5' 8 (1.727 m)   Wt 180 lb 6.4 oz (81.8 kg)   BMI 27.43 kg/m  Physical Exam Vitals reviewed.  Constitutional:      General: She is not in acute distress.    Appearance: Normal appearance. She is well-developed. She is not diaphoretic.  HENT:     Head: Normocephalic and atraumatic.   Eyes:     General: No scleral icterus.    Conjunctiva/sclera: Conjunctivae normal.   Neck:     Thyroid : No thyromegaly.   Cardiovascular:     Rate and Rhythm: Normal rate and regular rhythm.     Heart sounds:  Normal heart sounds. No murmur heard. Pulmonary:     Effort: Pulmonary effort is normal. No respiratory distress.     Breath sounds: Normal breath sounds. No wheezing, rhonchi or rales.   Musculoskeletal:     Cervical back: Neck supple.     Right lower leg: No edema.     Left lower leg: No edema.  Lymphadenopathy:     Cervical: No cervical adenopathy.   Skin:    General: Skin is warm and dry.   Neurological:     Mental Status: She is alert. Mental status is at baseline.     TTP over L pes anserine bursa  No results found for any visits on 04/29/24.  Assessment & Plan     Problem List Items Addressed This Visit       Other   Palpitations - Primary   Relevant Orders   Ambulatory referral to Cardiology   LONG TERM MONITOR (3-14 DAYS)   Other Visit Diagnoses       Irregular heart beat       Relevant Orders   Ambulatory referral to Cardiology   LONG TERM MONITOR (3-14 DAYS)     Pes anserinus bursitis of left knee               Ectopy, palpitations Intermittent PVCs during cataract surgery recovery with irregular rhythm on EKG. Not related to hyperthyroidism as  thyroid  levels are well-managed. Caffeine intake may contribute. Intraoperative ectopy noted, but heart rate and blood pressure were stable. Not atrial fibrillation as normal atrial beats were present. - Order Ziopatch for 14-day heart monitoring. - Refer to cardiology for further evaluation. - Advise to reduce caffeine intake.  Hyperthyroidism Previously treated with methimazole, now reduced to 2.5 mg due to well-managed thyroid  levels. Current thyroid  function tests are within normal limits.  Left knee pes anserine bursitis Pain in the left knee, especially at night, with stiffness after inactivity. No swelling observed. Likely due to overuse, consistent with pes anserine bursitis. - Advise rest and ice application to the affected area. - Recommend ibuprofen post-surgery for pain management. - Consider  using a compression sleeve for support if needed.        No orders of the defined types were placed in this encounter.    Return if symptoms worsen or fail to improve.      Jon Eva, MD  [facility] [phone] (phone) [phone] (fax)  [facility]

## 2024-04-30 DIAGNOSIS — Z961 Presence of intraocular lens: Secondary | ICD-10-CM | POA: Diagnosis not present

## 2024-04-30 DIAGNOSIS — Z9841 Cataract extraction status, right eye: Secondary | ICD-10-CM | POA: Diagnosis not present

## 2024-04-30 DIAGNOSIS — H2512 Age-related nuclear cataract, left eye: Secondary | ICD-10-CM | POA: Diagnosis not present

## 2024-04-30 DIAGNOSIS — H52222 Regular astigmatism, left eye: Secondary | ICD-10-CM | POA: Diagnosis not present

## 2024-05-27 DIAGNOSIS — I499 Cardiac arrhythmia, unspecified: Secondary | ICD-10-CM | POA: Diagnosis not present

## 2024-06-02 DIAGNOSIS — R002 Palpitations: Secondary | ICD-10-CM | POA: Diagnosis not present

## 2024-06-02 DIAGNOSIS — I499 Cardiac arrhythmia, unspecified: Secondary | ICD-10-CM

## 2024-06-03 ENCOUNTER — Ambulatory Visit: Payer: Self-pay | Admitting: Family Medicine

## 2024-06-05 ENCOUNTER — Ambulatory Visit: Attending: Medical | Admitting: Medical

## 2024-06-05 ENCOUNTER — Encounter: Payer: Self-pay | Admitting: Medical

## 2024-06-05 VITALS — BP 120/88 | HR 65 | Ht 67.5 in | Wt 179.1 lb

## 2024-06-05 DIAGNOSIS — R0602 Shortness of breath: Secondary | ICD-10-CM | POA: Diagnosis not present

## 2024-06-05 DIAGNOSIS — R002 Palpitations: Secondary | ICD-10-CM

## 2024-06-05 DIAGNOSIS — I480 Paroxysmal atrial fibrillation: Secondary | ICD-10-CM | POA: Diagnosis not present

## 2024-06-05 DIAGNOSIS — E0590 Thyrotoxicosis, unspecified without thyrotoxic crisis or storm: Secondary | ICD-10-CM

## 2024-06-05 MED ORDER — APIXABAN 5 MG PO TABS: 5.0000 mg | ORAL_TABLET | Freq: Two times a day (BID) | ORAL | 3 refills | Status: DC

## 2024-06-05 MED ORDER — METOPROLOL SUCCINATE ER 25 MG PO TB24: 12.5000 mg | ORAL_TABLET | Freq: Every day | ORAL | 1 refills | Status: AC

## 2024-06-05 NOTE — Addendum Note (Signed)
 Addended by: FRANCHESTER MIKEY DEL on: 06/05/2024 11:07 AM   Modules accepted: Level of Service

## 2024-06-05 NOTE — Patient Instructions (Signed)
 Medication Instructions:  START Toprol  XL 12.5 mg daily  START Eliquis  5 mg twice daily   *If you need a refill on your cardiac medications before your next appointment, please call your pharmacy*  Lab Work: Your provider would like for you to have following labs drawn today MAG, BMET.   If you have labs (blood work) drawn today and your tests are completely normal, you will receive your results only by: MyChart Message (if you have MyChart) OR A paper copy in the mail If you have any lab test that is abnormal or we need to change your treatment, we will call you to review the results.  Testing/Procedures: Your physician has requested that you have an echocardiogram. Echocardiography is a painless test that uses sound waves to create images of your heart. It provides your doctor with information about the size and shape of your heart and how well your heart's chambers and valves are working.   You may receive an ultrasound enhancing agent through an IV if needed to better visualize your heart during the echo. This procedure takes approximately one hour.  There are no restrictions for this procedure.  This will take place at [address] [facility] [address] [facility], [place] [address]  Please note: We ask at that you not bring children with you during ultrasound (echo/ vascular) testing. Due to room size and safety concerns, children are not allowed in the ultrasound rooms during exams. Our front office staff cannot provide observation of children in our lobby area while testing is being conducted. An adult accompanying a patient to their appointment will only be allowed in the ultrasound room at the discretion of the ultrasound technician under special circumstances. We apologize for any inconvenience.   Follow-Up: At [facility], you and your health needs are our priority.  As part of our continuing mission to provide you with exceptional heart care, our providers are all  part of one team.  This team includes your primary Cardiologist (physician) and Advanced Practice Providers or APPs (Physician Assistants and Nurse Practitioners) who all work together to provide you with the care you need, when you need it.  Your next appointment:   1 month(s)  Provider:   Caron Poser, MD  We recommend signing up for the patient portal called MyChart.  Sign up information is provided on this After Visit Summary.  MyChart is used to connect with patients for Virtual Visits (Telemedicine).  Patients are able to view lab/test results, encounter notes, upcoming appointments, etc.  Non-urgent messages can be sent to your provider as well.   To learn more about what you can do with MyChart, go to [email or web address].

## 2024-06-05 NOTE — Progress Notes (Addendum)
 Cardiology Office Note   Date:  06/05/2024  ID:  Rebekah [name], DOB [birth year], MRN [id number] PCP: Sharma Coyer, MD  [facility] Providers Cardiologist:  None   History of Present Illness Rebekah [name] is a 63 y.o. female with a h/o hyperthyroidism who presents as a new patient for palpitations.   Patient saw PCP for palpitaitons. Heart monitor showed NSR, HR 50-240. 6 episodes of NSVT, longest 9 beats, 64 SVT, longest 15 beats, <1% Afib, longest 3 minutes. She was referred to cardiology.   Today, she reports palpitations that occurred when she was found to have Graves disease in 2022. Once she was treated palpitations improved. More recently, she started feeling palpitations again and she was placed on methimazole. She had cataract surgery and had palpitations during the surgery. She has been limiting caffeine use, she feels this made a different. She had alcohol and felt palpitations. She is very active. Diet is good. She is unaware of sleep apnea. She denies chest pain. She has SOB during palpitations. She denies lightheadedness, dizziness, and lower leg edema.   Studies Reviewed EKG Interpretation Date/Time:  Thursday June 05 2024 08:42:32 EDT Ventricular Rate:  65 PR Interval:  146 QRS Duration:  98 QT Interval:  430 QTC Calculation: 447 R Axis:   67  Text Interpretation: Normal sinus rhythm Normal ECG No previous ECGs available Confirmed by Franchester, Satoya Feeley ([id number]) on 06/05/2024 8:45:52 AM    Heart monitor 05/2024 HR 50 to 240, average 78. 6 nonsustained VT, longest 9 beats. 64 nonsustained SVT, longest 15 beats. <1% AF, longest 3 min.  Occasional supraventricular ectopy. Rare ventricular ectopy. No sustained arrhythmias.   Cardiology referral placed based on protocol for new atrial fibrillation.   Risk Assessment/Calculations  CHA2DS2-VASc Score = 2   This indicates a 2.2% annual risk of stroke. The patient's score is based upon: CHF  History: 0 HTN History: 0 Diabetes History: 0 Stroke History: 0 Vascular Disease History: 0 Age Score: 1 Gender Score: 1             Physical Exam VS:  BP 120/88 (BP Location: Right Arm, Patient Position: Sitting, Cuff Size: Normal)   Pulse 65   Ht 5' 7.5 (1.715 m)   Wt 179 lb 2 oz (81.3 kg)   SpO2 98%   BMI 27.64 kg/m        Wt Readings from Last 3 Encounters:  06/05/24 179 lb 2 oz (81.3 kg)  04/29/24 180 lb 6.4 oz (81.8 kg)  12/03/23 178 lb 1.6 oz (80.8 kg)    GEN: Well nourished, well developed in no acute distress NECK: No JVD; No carotid bruits CARDIAC: RRR, no murmurs, rubs, gallops RESPIRATORY:  Clear to auscultation without rales, wheezing or rhonchi  ABDOMEN: Soft, non-tender, non-distended EXTREMITIES:  No edema; No deformity   ASSESSMENT AND PLAN  New onset Afib Patient has a history of intermittent palpitations, worse with thyroid  issues, caffeine and alcohol use. Recent heart monitor showed NSR with <1% AFib, longest episode 3 minutes. She did feel palpitations throughout the heart monitor., but only triggered once. Recent labs showed normal CBC and thyroid . I will check a Mag level and BMET. I will order an echocardiogram. CHADSVASC of 2 (due to age and female). I will start Eliquis  5mg  BID. I will have her see MD at follow-up.   Hyperthyroidism She is on methimazole. Recent thyroid  level normal.      Dispo: Follow-up in 1 month  Signed, Selen Smucker VEAR Franchester, PA-C

## 2024-06-06 ENCOUNTER — Ambulatory Visit: Payer: Self-pay | Admitting: Medical

## 2024-06-06 LAB — BASIC METABOLIC PANEL WITH GFR
BUN/Creatinine Ratio: 17 (ref 12–28)
BUN: 12 mg/dL (ref 8–27)
CO2: 19 mmol/L — ABNORMAL LOW (ref 20–29)
Calcium: 9 mg/dL (ref 8.7–10.3)
Chloride: 103 mmol/L (ref 96–106)
Creatinine, Ser: 0.69 mg/dL (ref 0.57–1.00)
Glucose: 85 mg/dL (ref 70–99)
Potassium: 4.2 mmol/L (ref 3.5–5.2)
Sodium: 140 mmol/L (ref 134–144)
eGFR: 96 mL/min/1.73 (ref >59)

## 2024-06-06 LAB — MAGNESIUM: Magnesium: 2 mg/dL (ref 1.6–2.3)

## 2024-07-10 ENCOUNTER — Ambulatory Visit: Attending: Medical

## 2024-07-10 DIAGNOSIS — R002 Palpitations: Secondary | ICD-10-CM

## 2024-07-10 LAB — ECHOCARDIOGRAM COMPLETE
AR max vel: 3.03 cm2
AV Area VTI: 2.58 cm2
AV Area mean vel: 2.46 cm2
AV Mean grad: 3 mmHg
AV Peak grad: 5.5 mmHg
Ao pk vel: 1.17 m/s
Area-P 1/2: 4.65 cm2
Calc EF: 59.1 %
S' Lateral: 2.8 cm
Single Plane A2C EF: 58.6 %
Single Plane A4C EF: 59.3 %

## 2024-07-14 DIAGNOSIS — E0590 Thyrotoxicosis, unspecified without thyrotoxic crisis or storm: Secondary | ICD-10-CM | POA: Diagnosis not present

## 2024-07-15 NOTE — Progress Notes (Unsigned)
  Cardiology Office Note   Date:  07/17/2024  ID:  Rebekah [name], DOB Dec 16, 1960, MRN [id number] PCP: Sharma Coyer, MD  [facility] Providers Cardiologist:  Caron Poser, MD     History of Present Illness Rebekah [name] is a 63 y.o. female PMH hyperthyroidism on methimazole who presents for further evaluation and management of palpitations and paroxysmal atrial fibrillation.  Patient recently seen in our office 06/05/2024.  She has been taking Eliquis  without any bleeding issues.  Her palpitations are very infrequent and spread out.  She denies any syncope or other high risk symptoms.  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  64 episodes SVT (longest 15 beats).  Less than 1% burden of atrial fibrillation (longest episode 3 minutes).  Occasional PACs, rare PVCs.   ROS: Pt denies any chest discomfort, jaw pain, arm pain, syncope, presyncope, orthopnea, PND, or LE edema.  Studies Reviewed I have independently reviewed the patient's ECG, previous cardiac testing, recent blood work, and recent medical record.  Physical Exam VS:  BP 98/62 (BP Location: Left Arm, Patient Position: Sitting, Cuff Size: Normal)   Pulse (!) 58   Ht 5' 7.5 (1.715 m)   Wt 177 lb (80.3 kg)   SpO2 98%   BMI 27.31 kg/m        Wt Readings from Last 3 Encounters:  07/17/24 177 lb (80.3 kg)  06/05/24 179 lb 2 oz (81.3 kg)  04/29/24 180 lb 6.4 oz (81.8 kg)    GEN: No acute distress. NECK: No JVD; No carotid bruits. CARDIAC: RRR, no murmurs, rubs, gallops. RESPIRATORY:  Clear to auscultation. EXTREMITIES:  Warm and well-perfused. No edema.  ASSESSMENT AND PLAN Palpitations Paroxysmal atrial fibrillation PSVT NSVT CHA2DS2-VASc score of 2.  Her longest captured episode of atrial fibrillation was less than 3 minutes; based on  recent data, 5 minutes is thought to be the threshold for clinically meaningful duration.  We discussed this at length today and decided to continue anticoagulation.  I also discussed having her see EP for consideration of advanced rhythm control strategies.  Since she is minimally symptomatic at this time and without significant atrial fibrillation burden, we decided to stay the course.  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  No dedicated cross-sectional imaging thus far.  We can plan on annual echo for surveillance for now.  We will do cross-sectional imaging if indicated.       Dispo: 1 year  Signed, Caron Poser, MD

## 2024-07-17 ENCOUNTER — Ambulatory Visit

## 2024-07-17 VITALS — BP 98/62 | HR 58 | Ht 67.5 in | Wt 177.0 lb

## 2024-07-17 DIAGNOSIS — I7121 Aneurysm of the ascending aorta, without rupture: Secondary | ICD-10-CM

## 2024-07-17 DIAGNOSIS — R002 Palpitations: Secondary | ICD-10-CM

## 2024-07-17 DIAGNOSIS — I480 Paroxysmal atrial fibrillation: Secondary | ICD-10-CM

## 2024-07-17 DIAGNOSIS — I4729 Other ventricular tachycardia: Secondary | ICD-10-CM | POA: Diagnosis not present

## 2024-07-17 NOTE — Patient Instructions (Signed)
 Medication Instructions:  Your physician recommends that you continue on your current medications as directed. Please refer to the Current Medication list given to you today.  *If you need a refill on your cardiac medications before your next appointment, please call your pharmacy*  Lab Work: No labs ordered today  If you have labs (blood work) drawn today and your tests are completely normal, you will receive your results only by: MyChart Message (if you have MyChart) OR A paper copy in the mail If you have any lab test that is abnormal or we need to change your treatment, we will call you to review the results.  Testing/Procedures:  1 Year Follow Up Echocardiogram:  Your physician has requested that you have an echocardiogram. Echocardiography is a painless test that uses sound waves to create images of your heart. It provides your doctor with information about the size and shape of your heart and how well your heart's chambers and valves are working.   You may receive an ultrasound enhancing agent through an IV if needed to better visualize your heart during the echo. This procedure takes approximately one hour.  There are no restrictions for this procedure.  This will take place at [address] [facility] [address], [place] [address]  Please note: We ask at that you not bring children with you during ultrasound (echo/ vascular) testing. Due to room size and safety concerns, children are not allowed in the ultrasound rooms during exams. Our front office staff cannot provide observation of children in our lobby area while testing is being conducted. An adult accompanying a patient to their appointment will only be allowed in the ultrasound room at the discretion of the ultrasound technician under special circumstances. We apologize for any inconvenience.   Follow-Up: At [facility], you and your health needs are our priority.  As part of our continuing mission to  provide you with exceptional heart care, our providers are all part of one team.  This team includes your primary Cardiologist (physician) and Advanced Practice Providers or APPs (Physician Assistants and Nurse Practitioners) who all work together to provide you with the care you need, when you need it.  Your next appointment:   12 month(s)  Provider:   You may see Caron Poser, MD or one of the following Advanced Practice Providers on your designated Care Team:    Cadence Franchester, [place]   We recommend signing up for the patient portal called MyChart.  Sign up information is provided on this After Visit Summary.  MyChart is used to connect with patients for Virtual Visits (Telemedicine).  Patients are able to view lab/test results, encounter notes, upcoming appointments, etc.  Non-urgent messages can be sent to your provider as well.   To learn more about what you can do with MyChart, go to [email or web address].

## 2024-07-18 ENCOUNTER — Other Ambulatory Visit: Payer: Self-pay | Admitting: *Deleted

## 2024-07-21 DIAGNOSIS — E0590 Thyrotoxicosis, unspecified without thyrotoxic crisis or storm: Secondary | ICD-10-CM | POA: Diagnosis not present

## 2024-07-21 DIAGNOSIS — E0500 Thyrotoxicosis with diffuse goiter without thyrotoxic crisis or storm: Secondary | ICD-10-CM | POA: Diagnosis not present

## 2024-09-01 DIAGNOSIS — E0590 Thyrotoxicosis, unspecified without thyrotoxic crisis or storm: Secondary | ICD-10-CM | POA: Diagnosis not present

## 2024-09-02 ENCOUNTER — Other Ambulatory Visit: Payer: Self-pay | Admitting: Medical

## 2024-09-03 NOTE — Telephone Encounter (Signed)
 Prescription refill request for Eliquis  received. Indication:afib Last office visit:9/25 Scr:0.69  8/25 Age: 63 Weight:80.3  kg  Prescription refilled

## 2024-09-23 ENCOUNTER — Ambulatory Visit (INDEPENDENT_AMBULATORY_CARE_PROVIDER_SITE_OTHER): Payer: BC Managed Care – PPO | Admitting: Dermatology

## 2024-09-23 DIAGNOSIS — D1801 Hemangioma of skin and subcutaneous tissue: Secondary | ICD-10-CM

## 2024-09-23 DIAGNOSIS — L80 Vitiligo: Secondary | ICD-10-CM | POA: Diagnosis not present

## 2024-09-23 DIAGNOSIS — D2371 Other benign neoplasm of skin of right lower limb, including hip: Secondary | ICD-10-CM

## 2024-09-23 DIAGNOSIS — W908XXA Exposure to other nonionizing radiation, initial encounter: Secondary | ICD-10-CM

## 2024-09-23 DIAGNOSIS — Z808 Family history of malignant neoplasm of other organs or systems: Secondary | ICD-10-CM

## 2024-09-23 DIAGNOSIS — D239 Other benign neoplasm of skin, unspecified: Secondary | ICD-10-CM

## 2024-09-23 DIAGNOSIS — L918 Other hypertrophic disorders of the skin: Secondary | ICD-10-CM

## 2024-09-23 DIAGNOSIS — L578 Other skin changes due to chronic exposure to nonionizing radiation: Secondary | ICD-10-CM

## 2024-09-23 DIAGNOSIS — Z1283 Encounter for screening for malignant neoplasm of skin: Secondary | ICD-10-CM

## 2024-09-23 DIAGNOSIS — L821 Other seborrheic keratosis: Secondary | ICD-10-CM

## 2024-09-23 DIAGNOSIS — L814 Other melanin hyperpigmentation: Secondary | ICD-10-CM

## 2024-09-23 DIAGNOSIS — I8393 Asymptomatic varicose veins of bilateral lower extremities: Secondary | ICD-10-CM

## 2024-09-23 DIAGNOSIS — L739 Follicular disorder, unspecified: Secondary | ICD-10-CM

## 2024-09-23 DIAGNOSIS — D229 Melanocytic nevi, unspecified: Secondary | ICD-10-CM

## 2024-09-23 DIAGNOSIS — S30810A Abrasion of lower back and pelvis, initial encounter: Secondary | ICD-10-CM

## 2024-09-23 DIAGNOSIS — D2271 Melanocytic nevi of right lower limb, including hip: Secondary | ICD-10-CM

## 2024-09-23 NOTE — Patient Instructions (Addendum)
 Skin tag removal generally is considered cosmetic and not covered by insurance.  Cosmetic removal fee is $115 for up to 15 tags removed. You can contact your insurance to see if skin tag removal is a medically necessary covered benefit.  Even if covered, copay and deductible payments would apply. CPT code: 88799 Diagnosis code: L91.8  Apply diclofenac (voltaren) gel twice a day to spots. This is an off-label use of an OTC arthritis gel that may help reduce/remove seborrheic keratosis (age spots).  Follow application amount limitations on tube to minimize side effects from absorption.  Otc La roche posay - dark spot lightener    Melanoma ABCDEs  Melanoma is the most dangerous type of skin cancer, and is the leading cause of death from skin disease.  You are more likely to develop melanoma if you: Have light-colored skin, light-colored eyes, or red or blond hair Spend a lot of time in the sun Tan regularly, either outdoors or in a tanning bed Have had blistering sunburns, especially during childhood Have a close family member who has had a melanoma Have atypical moles or large birthmarks  Early detection of melanoma is key since treatment is typically straightforward and cure rates are extremely high if we catch it early.   The first sign of melanoma is often a change in a mole or a new dark spot.  The ABCDE system is a way of remembering the signs of melanoma.  A for asymmetry:  The two halves do not match. B for border:  The edges of the growth are irregular. C for color:  A mixture of colors are present instead of an even brown color. D for diameter:  Melanomas are usually (but not always) greater than 6mm - the size of a pencil eraser. E for evolution:  The spot keeps changing in size, shape, and color.  Please check your skin once per month between visits. You can use a small mirror in front and a large mirror behind you to keep an eye on the back side or your body.   If you see  any new or changing lesions before your next follow-up, please call to schedule a visit.  Please continue daily skin protection including broad spectrum sunscreen SPF 30+ to sun-exposed areas, reapplying every 2 hours as needed when you're outdoors.   Staying in the shade or wearing long sleeves, sun glasses (UVA+UVB protection) and wide brim hats (4-inch brim around the entire circumference of the hat) are also recommended for sun protection.    Due to recent changes in healthcare laws, you may see results of your pathology and/or laboratory studies on MyChart before the doctors have had a chance to review them. We understand that in some cases there may be results that are confusing or concerning to you. Please understand that not all results are received at the same time and often the doctors may need to interpret multiple results in order to provide you with the best plan of care or course of treatment. Therefore, we ask that you please give us  2 business days to thoroughly review all your results before contacting the office for clarification. Should we see a critical lab result, you will be contacted sooner.   If You Need Anything After Your Visit  If you have any questions or concerns for your doctor, please call our main line at [phone] and press option 4 to reach your doctor's medical assistant. If no one answers, please leave a voicemail as directed and  we will return your call as soon as possible. Messages left after 4 pm will be answered the following business day.   You may also send us  a message via MyChart. We typically respond to MyChart messages within 1-2 business days.  For prescription refills, please ask your pharmacy to contact our office. Our fax number is [phone].  If you have an urgent issue when the clinic is closed that cannot wait until the next business day, you can page your doctor at the number below.    Please note that while we do our best to be available  for urgent issues outside of office hours, we are not available 24/7.   If you have an urgent issue and are unable to reach us , you may choose to seek medical care at your doctor's office, retail clinic, urgent care center, or emergency room.  If you have a medical emergency, please immediately call 911 or go to the emergency department.  Pager Numbers  - Dr. Hester: [phone]  - Dr. Jackquline: [phone]  - Dr. Claudene: [phone]   - Dr. Raymund: [phone]  In the event of inclement weather, please call our main line at [phone] for an update on the status of any delays or closures.  Dermatology Medication Tips: Please keep the boxes that topical medications come in in order to help keep track of the instructions about where and how to use these. Pharmacies typically print the medication instructions only on the boxes and not directly on the medication tubes.   If your medication is too expensive, please contact our office at [phone] option 4 or send us  a message through MyChart.   We are unable to tell what your co-pay for medications will be in advance as this is different depending on your insurance coverage. However, we may be able to find a substitute medication at lower cost or fill out paperwork to get insurance to cover a needed medication.   If a prior authorization is required to get your medication covered by your insurance company, please allow us  1-2 business days to complete this process.  Drug prices often vary depending on where the prescription is filled and some pharmacies may offer cheaper prices.  The website www.goodrx.com contains coupons for medications through different pharmacies. The prices here do not account for what the cost may be with help from insurance (it may be cheaper with your insurance), but the website can give you the price if you did not use any insurance.  - You can print the associated coupon and take it with your prescription  to the pharmacy.  - You may also stop by our office during regular business hours and pick up a GoodRx coupon card.  - If you need your prescription sent electronically to a different pharmacy, notify our office through [facility] or by phone at [phone] option 4.     Si Usted Necesita Algo Despus de Su Visita  Tambin puede enviarnos un mensaje a travs de Clinical Cytogeneticist. Por lo general respondemos a los mensajes de MyChart en el transcurso de 1 a 2 das hbiles.  Para renovar recetas, por favor pida a su farmacia que se ponga en contacto con nuestra oficina. Randi lakes de fax es [place] [phone].  Si tiene un asunto urgente cuando la clnica est cerrada y que no puede esperar hasta el siguiente da hbil, puede llamar/localizar a su doctor(a) al nmero que aparece a continuacin.   Por favor, tenga en cuenta que aunque hacemos  todo lo posible para estar disponibles para asuntos urgentes fuera del horario de oficina, no estamos disponibles las 24 horas del da, los 7 [address]  [address] de la [place].   Si tiene un problema urgente y no puede comunicarse con nosotros, puede optar por buscar atencin mdica  en el consultorio de su doctor(a), en una clnica privada, en un centro de atencin urgente o en una sala de emergencias.  Si tiene engineer, drilling, por favor llame inmediatamente al 911 o vaya a la sala de emergencias.  Nmeros de bper  - Dr. Hester: [phone]  - Dra. Jackquline: [phone]  - Dr. Claudene: [phone]  - Dra. Kitts: [phone]  En caso de inclemencias del [place], por favor llame a nuestra lnea principal al [phone] para una actualizacin sobre el estado de cualquier retraso o cierre.  Consejos para la medicacin en dermatologa: Por favor, guarde las cajas en las que vienen los medicamentos de uso tpico para ayudarle a seguir las instrucciones sobre dnde y cmo usarlos. Las farmacias generalmente imprimen las instrucciones del medicamento slo en  las cajas y no directamente en los tubos del [place].   Si su medicamento es muy caro, por favor, pngase en contacto con landry rieger llamando al [phone] y presione la opcin 4 o envenos un mensaje a travs de Clinical Cytogeneticist.   No podemos decirle cul ser su copago por los medicamentos por adelantado ya que esto es diferente dependiendo de la cobertura de su seguro. Sin embargo, es posible que podamos encontrar un medicamento sustituto a audiological scientist un formulario para que el seguro cubra el medicamento que se considera necesario.   Si se requiere una autorizacin previa para que su compaa de seguros cubra su medicamento, por favor permtanos de 1 a 2 das hbiles para completar este proceso.  Los precios de los medicamentos varan con frecuencia dependiendo del environmental consultant de dnde se surte la receta y alguna farmacias pueden ofrecer precios ms baratos.  El sitio web www.goodrx.com tiene cupones para medicamentos de health and safety inspector. Los precios aqu no tienen en cuenta lo que podra costar con la ayuda del seguro (puede ser ms barato con su seguro), pero el sitio web puede darle el precio si no utiliz tourist information centre manager.  - Puede imprimir el cupn correspondiente y llevarlo con su receta a la farmacia.  - Tambin puede pasar por nuestra oficina durante el horario de atencin regular y education officer, museum una tarjeta de cupones de GoodRx.  - Si necesita que su receta se enve electrnicamente a una farmacia diferente, informe a nuestra oficina a travs de MyChart de [place] o por telfono llamando al [phone] y presione la opcin 4.

## 2024-09-23 NOTE — Progress Notes (Signed)
 Follow-Up Visit   Subjective  Rebekah [name] is a 63 y.o. female who presents for the following: Skin Cancer Screening and Full Body Skin Exam Diagnosed with a-fib  Hx of family history of melanoma in father,  also in husband Hx of vitiligo  Dark spot at right thigh,Red spot at left breast, Some skin tags under right axilla  The patient presents for Total-Body Skin Exam (TBSE) for skin cancer screening and mole check. The patient has spots, moles and lesions to be evaluated, some may be new or changing and the patient may have concern these could be cancer.    The following portions of the chart were reviewed this encounter and updated as appropriate: medications, allergies, medical history  Review of Systems:  No other skin or systemic complaints except as noted in HPI or Assessment and Plan.  Objective  Well appearing patient in no apparent distress; mood and affect are within normal limits.  A full examination was performed including scalp, head, eyes, ears, nose, lips, neck, chest, axillae, abdomen, back, buttocks, bilateral upper extremities, bilateral lower extremities, hands, feet, fingers, toes, fingernails, and toenails. All findings within normal limits unless otherwise noted below.   Relevant physical exam findings are noted in the Assessment and Plan.    Assessment & Plan   SKIN CANCER SCREENING PERFORMED TODAY.  ACTINIC DAMAGE - Chronic condition, secondary to cumulative UV/sun exposure - diffuse scaly erythematous macules with underlying dyspigmentation - Recommend daily broad spectrum sunscreen SPF 30+ to sun-exposed areas, reapply every 2 hours as needed.  - Staying in the shade or wearing long sleeves, sun glasses (UVA+UVB protection) and wide brim hats (4-inch brim around the entire circumference of the hat) are also recommended for sun protection.  - Call for new or changing lesions.  LENTIGINES, SEBORRHEIC KERATOSES, HEMANGIOMAS - Benign normal skin  lesions - Benign-appearing - Call for any changes - lentigo on right nasal dorsum 5 x 4 mm light tan speckled macule  Counseling for BBL / IPL / Laser and Coordination of Care Discussed the treatment option of Broad Band Light (BBL) Sinclair Pulsed Light (IPL)/ Laser for skin discoloration, including brown spots and redness.  Typically we recommend at least 1-3 treatment sessions about 5-8 weeks apart for best results.  Cannot have tanned skin when BBL performed, and regular use of sunscreen/photoprotection is advised after the procedure to help maintain results. The patient's condition may also require maintenance treatments in the future.  The fee for BBL / laser treatments is $350 per treatment session for the whole face.  A fee can be quoted for other parts of the body.  Insurance typically does not pay for BBL/laser treatments and therefore the fee is an out-of-pocket cost. Recommend prophylactic valtrex treatment. Once scheduled for procedure, will send Rx in prior to patient's appointment.     MELANOCYTIC NEVI - 2 mm med dark brown macule right anterior thigh (vs SK) - Tan-brown and/or pink-flesh-colored symmetric macules and papules - Benign appearing on exam today - Observation - Call clinic for new or changing moles - Recommend daily use of broad spectrum spf 30+ sunscreen to sun-exposed areas.   Acrochordons (Skin Tags) At right axilla  - Fleshy, skin-colored pedunculated papules - Benign appearing.  - Observe. - If desired, they can be removed with an in office procedure that is not covered by insurance. Pt prefers to contact insurance before removing, - Please call the clinic if you notice any new or changing lesions. Discussed Skin tag  removal generally is considered cosmetic and not covered by insurance.  Cosmetic removal fee is $115 for up to 15 tags removed. You can contact your insurance to see if skin tag removal is a medically necessary covered benefit.  Even if  covered, copay and deductible payments would apply. CPT code: 88799 Diagnosis code: L91.8  FOLLICULITIS Exam: Pink inflammatory papule at left inferior breast Folliculitis occurs due to inflammation of the superficial hair follicle (pore), resulting in acne-like lesions (pus bumps). It can be infectious (bacterial, fungal) or noninfectious (shaving, tight clothing, heat/sweat, medications).  Folliculitis can be acute or chronic and recommended treatment depends on the underlying cause of folliculitis.  Treatment Plan: Mild. No treatment at this time  Varicose Veins/Spider Veins - Dilated blue, purple or red veins at the lower extremities - Reassured - Smaller vessels can be treated by sclerotherapy (a procedure to inject a medicine into the veins to make them disappear) if desired, but the treatment is not covered by insurance. Larger vessels may be covered if symptomatic and we would refer to vascular surgeon if treatment desired.  DERMATOFIBROMA At right medial knee Exam: Firm pink/brown papulenodule with dimple sign.   Treatment Plan: A dermatofibroma is a benign growth possibly related to trauma, such as an insect bite, cut from shaving, or inflamed acne-type bump.  Treatment options to remove include shave or excision with resulting scar and risk of recurrence.  Since benign-appearing and not bothersome, will observe for now.   Vitiligo chest, arms, wrist, knees, feet, hands   Exam: depigmented patches at b/l axilla b/l wrist, and b/l foot dorsum, right clavicle area    Chronic and persistent condition with duration or expected duration over one year. Condition is improving with treatment but not currently at goal.    Reviewed chronic nature, no cure and can be difficult to treat.  Vitiligo is an autoimmune condition which causes loss of skin pigment and is commonly seen on the face and may also involve areas of trauma like hands, elbows, knees, and ankles.  Treatments include  topical steroids and other topical anti-inflammatory ointments/creams and topical and oral Jak inhibitors.  Sometimes narrow band UV light therapy or Xtrac laser is helpful, both of which require twice weekly treatments for at least 3-6 months.  Antioxidant vitamins, such as Vitamins A,C,E,D, Folic Acid and B12 may be added to enhance treatment.   Treatment Plan:  Cont Opzelura  cr qd/bid aa vitiligo Cont Vitamins A,C,E,C, folic acid and B12  EXCORIATION Exam: Excoriation at upper back   Treatment Plan: Recommend vaseline. Call if not resolving.    FAMILY HISTORY OF SKIN CANCER What type(s): melanoma  Who affected: father   VITILIGO   Related Medications Ruxolitinib Phosphate  (OPZELURA ) 1.5 % CREA Apply 1 application  topically 2 (two) times daily. Apply to aa hands, wrist, knees, feet, toes, chest bid Return in about 1 year (around 09/23/2025) for TBSE.  I, Eleanor Blush, CMA, am acting as scribe for Rexene Rattler, MD.   Documentation: I have reviewed the above documentation for accuracy and completeness, and I agree with the above.  Rexene Rattler, MD

## 2025-10-12 ENCOUNTER — Ambulatory Visit: Admitting: Dermatology
